# Patient Record
Sex: Male | Born: 1950 | Race: White | Hispanic: No | Marital: Married | Smoking: Former smoker
Health system: Southern US, Community
[De-identification: ages and names within clinical notes are randomized; demographics above are authoritative.]

## PROBLEM LIST (undated history)

## (undated) DIAGNOSIS — R51 Headache: Secondary | ICD-10-CM

## (undated) DIAGNOSIS — Z95810 Presence of automatic (implantable) cardiac defibrillator: Secondary | ICD-10-CM

## (undated) DIAGNOSIS — J189 Pneumonia, unspecified organism: Secondary | ICD-10-CM

## (undated) DIAGNOSIS — I255 Ischemic cardiomyopathy: Secondary | ICD-10-CM

## (undated) DIAGNOSIS — Z950 Presence of cardiac pacemaker: Secondary | ICD-10-CM

## (undated) DIAGNOSIS — I472 Ventricular tachycardia, unspecified: Secondary | ICD-10-CM

## (undated) DIAGNOSIS — K219 Gastro-esophageal reflux disease without esophagitis: Secondary | ICD-10-CM

## (undated) DIAGNOSIS — J439 Emphysema, unspecified: Secondary | ICD-10-CM

## (undated) DIAGNOSIS — I5032 Chronic diastolic (congestive) heart failure: Secondary | ICD-10-CM

## (undated) DIAGNOSIS — R0602 Shortness of breath: Secondary | ICD-10-CM

## (undated) DIAGNOSIS — I5022 Chronic systolic (congestive) heart failure: Secondary | ICD-10-CM

## (undated) DIAGNOSIS — F329 Major depressive disorder, single episode, unspecified: Secondary | ICD-10-CM

## (undated) DIAGNOSIS — F32A Depression, unspecified: Secondary | ICD-10-CM

## (undated) DIAGNOSIS — N189 Chronic kidney disease, unspecified: Secondary | ICD-10-CM

## (undated) DIAGNOSIS — F419 Anxiety disorder, unspecified: Secondary | ICD-10-CM

## (undated) HISTORY — DX: Chronic systolic (congestive) heart failure: I50.22

## (undated) HISTORY — PX: EYE SURGERY: SHX253

## (undated) HISTORY — DX: Emphysema, unspecified: J43.9

## (undated) HISTORY — PX: CORONARY ANGIOPLASTY: SHX604

## (undated) HISTORY — DX: Chronic diastolic (congestive) heart failure: I50.32

---

## 1997-02-08 HISTORY — PX: HERNIA REPAIR: SHX51

## 2002-06-18 ENCOUNTER — Emergency Department (HOSPITAL_COMMUNITY): Admission: AD | Admit: 2002-06-18 | Discharge: 2002-06-18 | Payer: Self-pay | Admitting: Emergency Medicine

## 2008-06-08 HISTORY — PX: CARDIAC DEFIBRILLATOR PLACEMENT: SHX171

## 2008-06-08 HISTORY — PX: CORONARY STENT PLACEMENT: SHX1402

## 2008-06-12 ENCOUNTER — Inpatient Hospital Stay: Payer: Self-pay | Admitting: Cardiovascular Disease

## 2008-06-12 ENCOUNTER — Encounter: Payer: Self-pay | Admitting: Cardiovascular Disease

## 2008-06-21 ENCOUNTER — Encounter: Payer: Self-pay | Admitting: Cardiovascular Disease

## 2008-07-29 ENCOUNTER — Encounter: Payer: Self-pay | Admitting: Cardiovascular Disease

## 2008-09-18 ENCOUNTER — Encounter: Payer: Self-pay | Admitting: Cardiovascular Disease

## 2008-10-16 ENCOUNTER — Encounter: Payer: Self-pay | Admitting: Cardiovascular Disease

## 2008-11-29 ENCOUNTER — Ambulatory Visit: Payer: Self-pay | Admitting: Internal Medicine

## 2009-05-13 ENCOUNTER — Ambulatory Visit: Payer: Self-pay | Admitting: Ophthalmology

## 2009-05-15 ENCOUNTER — Encounter: Payer: Self-pay | Admitting: Cardiovascular Disease

## 2009-05-27 ENCOUNTER — Ambulatory Visit: Payer: Self-pay | Admitting: Ophthalmology

## 2009-07-03 ENCOUNTER — Ambulatory Visit: Payer: Self-pay | Admitting: Ophthalmology

## 2009-07-07 ENCOUNTER — Ambulatory Visit: Payer: Self-pay | Admitting: Ophthalmology

## 2009-09-09 ENCOUNTER — Encounter: Payer: Self-pay | Admitting: Cardiovascular Disease

## 2009-11-19 ENCOUNTER — Ambulatory Visit: Payer: Self-pay | Admitting: Cardiovascular Disease

## 2009-11-19 ENCOUNTER — Telehealth: Payer: Self-pay | Admitting: Cardiovascular Disease

## 2009-11-19 DIAGNOSIS — Z95810 Presence of automatic (implantable) cardiac defibrillator: Secondary | ICD-10-CM | POA: Insufficient documentation

## 2009-11-19 DIAGNOSIS — I2510 Atherosclerotic heart disease of native coronary artery without angina pectoris: Secondary | ICD-10-CM | POA: Insufficient documentation

## 2009-11-19 DIAGNOSIS — E785 Hyperlipidemia, unspecified: Secondary | ICD-10-CM

## 2009-11-19 DIAGNOSIS — I2589 Other forms of chronic ischemic heart disease: Secondary | ICD-10-CM

## 2009-11-27 ENCOUNTER — Telehealth: Payer: Self-pay | Admitting: Cardiovascular Disease

## 2009-12-29 ENCOUNTER — Ambulatory Visit: Payer: Self-pay | Admitting: Internal Medicine

## 2009-12-29 DIAGNOSIS — I472 Ventricular tachycardia: Secondary | ICD-10-CM

## 2009-12-29 DIAGNOSIS — I5022 Chronic systolic (congestive) heart failure: Secondary | ICD-10-CM

## 2009-12-29 DIAGNOSIS — I959 Hypotension, unspecified: Secondary | ICD-10-CM

## 2010-01-26 ENCOUNTER — Ambulatory Visit: Payer: Self-pay | Admitting: Allergy

## 2010-03-10 NOTE — Letter (Signed)
Summary: Medical Record Release  Medical Record Release   Imported By: Robert [name] 11/28/2009 11:11:13  _____________________________________________________________________  External Attachment:    Type:   Image     Comment:   External Document

## 2010-03-10 NOTE — Assessment & Plan Note (Signed)
Summary: NP6/AMD   Visit Type:  Initial Consult Primary Provider:  Dr. Juel Burrow  CC:  Establish care with a local cardiologist.  Had an MI; stent 2010 at [facility].  Recently blood pressure has been increased with feeling fatigue and dizziness.Marland Kitchen  History of Present Illness: [name] is a very pleasant 60 year old gentleman, former veteran with a history smoking for 30 years, hyperlipidemia, severe coronary artery disease, ischemic cardiomyopathy with ejection fraction of 20-25% by echocardiogram in May 2010, history of stent to his LAD for 95% stenosis in 1996, remote history of chemical pneumonitis him a catheterization in May 2004 showing 70% mid RCA disease, 90% distal RCA disease, 70% LAD disease proximal to the stent who had Cypher stents placement to the RCA proximal, 3 x 13 mm.  He had a Stamey 400% occluded LAD proximally in May 2010 with intervention performed, subsequent VT and balloon pump placed with transfer to [facility]. Records are not available to [place] at this time. He does have an ICD for his ischemic heart myopathy and secondary prevention.  He presents to establish care. He reports that recently, over the past several weeks to months, he has been feeling very weak and dizzy. He describes a recent near syncopal episode where he almost passed out and hit his head on the nightstand. He does have recordings of his blood pressure which showed frequent systolic pressures in the 70s. He denies any chest pain or shortness of breath. We do not have his most recent echocardiogram from this year from Dr. Harl Bowie. I will try to obtain his records He denies any recent medication changes.  EKG shows normal sinus rhythm with rate 71 beats per minute with old anteroseptal infarct, low voltage through the limb leads.  Preventive Screening-Counseling & Management  Alcohol-Tobacco     Smoking Status: quit  Caffeine-Diet-Exercise     Does Patient Exercise: yes      Drug Use:  no.    Current  Medications (verified): 1)  Plavix 75 Mg Tabs (Clopidogrel Bisulfate) .... One Tablet Once Daily 2)  Omega-3 Fish Oil 1200 Mg Caps (Omega-3 Fatty Acids) .... One Tablet Once Daily 3)  Magnesium Oxide 400 Mg Tabs (Magnesium Oxide) .... One Tablet Once Daily 4)  Coreg 3.125 Mg Tabs (Carvedilol) .... One Tablet Every 12 Hours 5)  Spironolactone 25 Mg Tabs (Spironolactone) .... One Tablet Once Daily 6)  Fluticasone Propionate 50 Mcg/act Susp (Fluticasone Propionate) .... As Needed 7)  Aspir-Low 81 Mg Tbec (Aspirin) .... One Tablet Once Daily 8)  Simvastatin 40 Mg Tabs (Simvastatin) .... One Tablet At Bedtime 9)  Loratadine 10 Mg Tabs (Loratadine) .... One Tablet Once Daily 10)  Furosemide 80 Mg Tabs (Furosemide) .... One Tablet Once Daily 11)  Lisinopril 5 Mg Tabs (Lisinopril) .... 1/2 Tablet Once Daily 12)  Sertraline Hcl 50 Mg Tabs (Sertraline Hcl) .... One Tablet Once Daily 13)  Cetirizine Hcl 10 Mg Tabs (Cetirizine Hcl) .... One Tablet Once Daily 14)  Amiodarone Hcl 200 Mg Tabs (Amiodarone Hcl) .... One Tablet Once Daily 15)  Nitroglycerin 0.4 Mg/hr Pt24 (Nitroglycerin) .... As Needed 16)  Zolpidem Tartrate 5 Mg Tabs (Zolpidem Tartrate) .... One Tablet At Bedtime 17)  Spiriva Handihaler 18 Mcg Caps (Tiotropium Bromide Monohydrate) 18)  Ventolin Hfa 108 (90 Base) Mcg/act Aers (Albuterol Sulfate) .... Two Puffs Daily 19)  Patanase 0.6 % Soln (Olopatadine Hcl) .... Two Puffs Once Daily 20)  Dulera 100-5 Mcg/act Aero (Mometasone Furo-Formoterol Fum) .... As Needed  Allergies (verified): No Known  Drug Allergies  Past History:  Family History: Last updated: 11/19/2009 Mother: Living Father: deceased; Electricuted  Social History: Last updated: 11/19/2009 Disabled--2010 Married  Tobacco Use - Former. Smoked 1PPD x 30 years. 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 Smoked 1PPD x 30 years. Quit Jun 10, 2008 Alcohol Use - yes Regular Exercise - yes--yard work Drug Use - no Smoking Status:  quit Does Patient Exercise:  yes Drug Use:  no  Review of Systems  The patient denies fever, weight loss, weight gain, vision loss, decreased hearing, hoarseness, chest pain, syncope, dyspnea on exertion, peripheral edema, prolonged cough, abdominal pain, incontinence, muscle weakness, depression, and enlarged lymph nodes.         dizzy and weak  Vital Signs:  Patient profile:   60 year old male Height:      69 inches Weight:      164 pounds BMI:     24.31 Pulse rate:   71 / minute BP sitting:   109 / 69  (left arm) Cuff size:   regular  Vitals Entered By: Bishop Dublin, CMA (November 19, 2009 11:15 AM)  Physical Exam  General:  Well developed, well nourished, in no acute distress. Head:  normocephalic and atraumatic Neck:  Neck supple, no JVD. No masses, thyromegaly or abnormal cervical nodes. Lungs:  Clear bilaterally to auscultation and percussion. Heart:  Non-displaced PMI, chest non-tender; regular rate and rhythm, S1, S2 without murmurs, rubs or gallops. Carotid upstroke normal, no bruit. no widened aortic pulsation.  Pedals normal pulses. No edema, no varicosities. Abdomen:  Bowel sounds positive; abdomen soft and non-tender without masses Msk:  Back normal, normal gait. Muscle strength and tone normal. Pulses:  mildly diminished pulses in the lower extremities but good capillary refill in the upper and lower  extremities Extremities:  No clubbing or cyanosis. Neurologic:  Alert and oriented x 3. Skin:  Intact without lesions or rashes. Psych:  Normal affect.   Impression & Recommendations:  Problem # 1:  CARDIOMYOPATHY, ISCHEMIC (ICD-414.8) ejection fraction is 22%. He has hypotension which is likely secondary to his underlying cardiac disease. In an effort to improve his near syncopal episodes, we will change his lisinopril 2.5 mg 2 every other day. I'm also concerned that he could be mildly dehydrated as he is on Lasix 80 mg daily. I suggested he cut his Lasix back to 40 mg daily on a trial basis to see if this helps with his near syncope. On talking his most recent basic metabolic panel from Dr. Harl Bowie.  His updated medication list for this problem includes:    Plavix 75 Mg Tabs (Clopidogrel bisulfate) ..... One tablet once daily    Coreg 3.125 Mg Tabs (Carvedilol) ..... One tablet every 12 hours    Spironolactone 25 Mg Tabs (Spironolactone) ..... One tablet once daily    Aspir-low 81 Mg Tbec (Aspirin) ..... One tablet once daily    Furosemide 80 Mg Tabs (Furosemide) .Marland Kitchen... Take 1/2  tablet by mouth once a  day    Lisinopril 5 Mg Tabs (Lisinopril) .Marland Kitchen... Take 1/2 tab by mouth every other day.    Amiodarone Hcl 200 Mg Tabs (Amiodarone hcl) ..... One tablet once daily    Nitroglycerin 0.4 Mg/hr Pt24 (Nitroglycerin) .Marland Kitchen... As needed  Problem # 2:  CAD, NATIVE VESSEL (ICD-414.01) He denies any recent anginal type symptoms. No further testing at this time.  His updated medication list for this problem includes:    Plavix 75 Mg Tabs (Clopidogrel bisulfate) ..... One tablet once daily    Coreg 3.125 Mg Tabs (Carvedilol) ..... One tablet every 12 hours    Aspir-low 81 Mg Tbec (Aspirin) ..... One tablet once daily    Lisinopril 5 Mg Tabs (Lisinopril) .Marland Kitchen... Take 1/2 tab by mouth every other day.    Nitroglycerin 0.4 Mg/hr Pt24 (Nitroglycerin) .Marland Kitchen... As needed  Problem # 3:  HYPERLIPIDEMIA-MIXED  (ICD-272.4) Goal LDL is less than 70, total cholesterol less than 150. We'll try to obtain his lipids prior records.  His updated medication list for this problem includes:    Simvastatin 40 Mg Tabs (Simvastatin) ..... One tablet at bedtime  Problem # 4:  AUTOMATIC IMPLANTABLE CARDIAC DEFIBRILLATOR SITU (ICD-V45.02) Ejection fraction is 22 for 25%. We will set him up for a defibrillator check on a every 3 month basis with [place] [facility]. He has indicated that he does have a telephone adapter which we might be able to use for remote checking  Patient Instructions: 1)  Your physician has recommended you make the following change in your medication: DECREASE lisinopril to everyother day, lasix 80mg  1/2 tab daily  2)  Your physician wants you to follow-up in:   3 months You will receive a reminder letter in the mail two months in advance. If you don't receive a letter, please call our office to schedule the follow-up appointment.

## 2010-03-10 NOTE — Cardiovascular Report (Signed)
[facility]ist, [facility]   [facility]   Imported By: Roderic Ovens 12/01/2009 15:06:03  _____________________________________________________________________  External Attachment:    Type:   Image     Comment:   External Document

## 2010-03-10 NOTE — Letter (Signed)
Summary: Implanted Cardiovascular Devices   Implanted Cardiovascular Devices   Imported By: Roderic Ovens 12/02/2009 10:16:05  _____________________________________________________________________  External Attachment:    Type:   Image     Comment:   External Document

## 2010-03-10 NOTE — Progress Notes (Signed)
Summary: Dizziness  Phone Note Call from Patient   Caller: Patient Call For: [place] Summary of Call: Note entered in error on another pt from 11/14/09 Pt called stated needed to see Cardiologist in the next 24 hours. Hx of MI in the past year.  Three days ago pt started experiecing dizziness and blacked out. Pt reports BP at 71/47 and 84/56. Home meds lisinopril 5 mg 1/2 tab daily, furosemide 80mg  daily, spironalatone 25mg  daily and carvediolol 3.125 two times a day  Follow-up for Phone Call        Spoke to Dr. Mariah Milling recommened pt stop lisinopril and carvedilol. Continue monitoring BP and HR. If BP not improved can go to Urgent care or PCP and schedule pt for next available appt. Pt scheduled for 10/13. Pt is aware of medication changes and continue to monitor BP and HR will bring those records into visit. Benedict Needy, RN  November 19, 2009 11:26 AM

## 2010-03-10 NOTE — Cardiovascular Report (Signed)
Summary: Stent Patient Implant Card   Stent Patient Implant Card   Imported By: Roderic Ovens 12/02/2009 10:17:01  _____________________________________________________________________  External Attachment:    Type:   Image     Comment:   External Document

## 2010-03-10 NOTE — Assessment & Plan Note (Signed)
Summary: NEP/AMD   Visit Type:  Initial Consult Primary [name]:  Dr. Juel Burrow  CC:  Establish care for Defib check..  History of Present Illness: [name] is  seen at the request of Dr. Mariah Milling 2 established care for an ICD.  He is a  60 year old gentleman, former veteran with a history  ischemic heart disease with depressed left ventricular function.Hisejection fraction was  20-25% by echocardiogram in May 2010  At that time he presented with aanterior STEMI and was found at catheterization to occluded his LAD stent. (This is my best inference from reading catheter note).At that time he underwent insertion of balloon pump and was transferred to [facility]. An ICD was implanted. There is a suggestion of the old notes that this is a placed for secondary prevention.  The patient recalls having been told he had a problem for which an ablation or defibrillator would be appropriate; at that time he was intubated and lying at [facility]. They opted for the latter. He was put on amiodarone at that time and the dose was reduced subsequent to that. I should note that he was at [facility] for 3-4 months intubated and comatose for more than a month.  He has had problems with recurrent orthostatic syncope and has recorded his blood pressure at home with these episodes in the 50 range. Reduction of his diuretics has been associated with some improvement.  He has been better since then with less dizziness.     Current Medications (verified): 1)  Plavix 75 Mg Tabs (Clopidogrel Bisulfate) .... One Tablet Once Daily 2)  Omega-3 Fish Oil 1200 Mg Caps (Omega-3 Fatty Acids) .... One Tablet Once Daily 3)  Magnesium Oxide 400 Mg Tabs (Magnesium Oxide) .... One Tablet Once Daily 4)  Coreg 3.125 Mg Tabs (Carvedilol) .... One Tablet Every 12 Hours 5)  Spironolactone 25 Mg Tabs (Spironolactone) .... One Tablet Once Daily 6)  Fluticasone Propionate 50 Mcg/act Susp (Fluticasone Propionate) .... As Needed 7)  Aspir-Low 81 Mg Tbec  (Aspirin) .... One Tablet Once Daily 8)  Simvastatin 40 Mg Tabs (Simvastatin) .... One Tablet At Bedtime 9)  Loratadine 10 Mg Tabs (Loratadine) .... One Tablet Once Daily 10)  Furosemide 80 Mg Tabs (Furosemide) .... Take 1/2  Tablet By Mouth Once A Day 11)  Lisinopril 5 Mg Tabs (Lisinopril) .... Take 1/2 Tab By Mouth Every Other Day. 12)  Sertraline Hcl 50 Mg Tabs (Sertraline Hcl) .... One Tablet Once Daily 13)  Cetirizine Hcl 10 Mg Tabs (Cetirizine Hcl) .... One Tablet Once Daily 14)  Amiodarone Hcl 200 Mg Tabs (Amiodarone Hcl) .... One Tablet Once Daily 15)  Nitroglycerin 0.4 Mg/hr Pt24 (Nitroglycerin) .... As Needed 16)  Zolpidem Tartrate 5 Mg Tabs (Zolpidem Tartrate) .... One Tablet At Bedtime 17)  Spiriva Handihaler 18 Mcg Caps (Tiotropium Bromide Monohydrate) 18)  Ventolin Hfa 108 (90 Base) Mcg/act Aers (Albuterol Sulfate) .... Two Puffs Daily 19)  Patanase 0.6 % Soln (Olopatadine Hcl) .... Two Puffs Once Daily 20)  Dulera 100-5 Mcg/act Aero (Mometasone Furo-Formoterol Fum) .... 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 Smoked 1PPD x 30 years. Quit Jun 10, 2008 Alcohol Use - yes Regular Exercise - yes--yard work Drug Use - no  Risk Factors: Exercise: yes (11/19/2009)  Risk Factors: Smoking Status: quit (11/19/2009)  Vital Signs:  Patient profile:   60 year old male Height:      69 inches Weight:      167 pounds BMI:     24.75 Pulse  rate:   50 / minute BP sitting:   80 / 60  (left arm)  Vitals Entered By: Bishop Dublin, CMA (December 29, 2009 10:33 AM)  Physical Exam  General:  Well developed, well nourished,middle-age Caucasian male appearing his stated age in no acute distress. Head:  normal HEENT Neck:  JVP 6-7; neck supple Chest Wall:  without kyphosis Lungs:  clear to auscultation Heart:  regular rate and rhythm without an S3 Abdomen:  soft distended active bowel sounds without hepatomegaly Msk:  no musculoskeletal deformities Pulses:  intact distal pulses Extremities:  no clubbing cyanosis or edema Neurologic:  alert and oriented and grossly normal Skin:  warm and dry Cervical Nodes:  without adenopathy Psych:  engaging   EKG  Procedure date:  11/19/2009  Findings:      sinus rhythm at 71 Intervals 0.21/0.10/0.44 Axis is rightward at 117 Prior anteroseptal MI   ICD Specifications Following MD:  Sherryl Manges, MD     ICD Vendor:  [employer]     ICD Model Number:  [id number]     ICD Serial Number:  [id number] ICD DOI:  08/19/2008     ICD Implanting MD:  IMPLANTED @ [facility]  Lead 1:    Location: RA     DOI: 08/19/2008     Model #: [id number]     Serial #: [id number]     Status: active Lead 2:    Location: RV     DOI: 08/19/2008     Model #: [id number]     Serial #: [id number]     Status: active  Indications::  ICM   ICD Follow Up Remote Check?  No Battery Voltage:  3.14 V     Charge Time:  8.7 seconds     Underlying rhythm:  SR ICD Dependent:  No       ICD Device Measurements Atrium:  Amplitude: 2.1 mV, Impedance: 494 ohms, Threshold: 1.0 V at 0.4 msec Right Ventricle:  Amplitude: 15.9 mV, Impedance: 437 ohms, Threshold: 1.125 V at 0.4 msec Shock Impedance: 37/44 ohms   Episodes MS Episodes:  0     Percent Mode Switch:  0     Coumadin:  No Shock:  0     ATP:  0     Nonsustained:  0     Atrial Pacing:  21.7%     Ventricular Pacing:  0.2%  Brady Parameters Mode DDD     Lower Rate Limit:  60     Upper Rate  Limit 130 PAV 180     Sensed AV Delay:  150  Tachy Zones VF:  188     Next Remote Date:  04/02/2010     Next Cardiology Appt Due:  12/10/2010 Tech Comments:  Lead impedance alert values reprogrammed.  Optivol and thoracic impedance abnormal. 11/12-11/19.  Carelink transmissions every 3 months.  ROV 1 year with Dr. Graciela Husbands in [place]. Altha Harm, LPN  December 29, 2009 10:50 AM   Impression & Recommendations:  Problem # 1:  AUTOMATIC IMPLANTABLE CARDIAC DEFIBRILLATOR SITU (ICD-V45.02) Device  parameters and data were reviewed; given his hypotension I have increased his daytime heart rate from 60-75. Sleep mode was activated. The device is programmed as a single zone with ATP while charting.  There have been no intercurrent arrhythmias.  Problem # 2:  VENTRICULAR TACHYCARDIA (ICD-427.1)  aabove  His updated medication list for this problem includes:    Plavix 75 Mg Tabs (Clopidogrel bisulfate) ..... One tablet once daily    Coreg 3.125 Mg Tabs (Carvedilol) ..... One tablet every 12 hours    Aspir-low 81 Mg Tbec (Aspirin) ..... One tablet once daily    Lisinopril 5 Mg Tabs (Lisinopril) .Marland Kitchen... Take 1/2 tab by mouth every other day.    Amiodarone Hcl 200 Mg Tabs (Amiodarone hcl) .Marland Kitchen... Take one half tablet once daily    Nitroglycerin 0.4 Mg/hr Pt24 (Nitroglycerin) .Marland Kitchen... As needed  Problem # 3:  HYPOTENSION, UNSPECIFIED (ICD-458.9) given his hypotension, I have increased his heart rate. I will also decrease his spironolactone from 25-12.5.  Is in my impression that amiodarone can also be associated with orthostatic intolerance. As there had been no intercurrent arrhythmias since the device was implanted, I , after a long discussion with the patien, have elected to decrease his amiodarone from 200-100 day with the hopes of being to further decrease it.  We will leave his Lasix at its reduced dose.  Problem # 4:  SYSTOLIC HEART FAILURE, CHRONIC (ICD-428.22) Interestingly his optivol  measurement demonstrates a reduction since he saw Dr. Mariah Milling a few weeks ago at which time his Lasix dose was decreased. We will keep it at a reduced dose.  Problem # 5:  CARDIOMYOPATHY, ISCHEMIC (ICD-414.8)  stable without chest pain  His updated medication list for this problem includes:    Plavix 75 Mg Tabs (Clopidogrel bisulfate) ..... One tablet once daily    Coreg 3.125 Mg Tabs (Carvedilol) ..... One tablet every 12 hours    Spironolactone 25 Mg Tabs (Spironolactone) .Marland Kitchen... Take one half  tablet once daily    Aspir-low 81 Mg Tbec (Aspirin) ..... One tablet once daily    Furosemide 80 Mg Tabs (Furosemide) .Marland Kitchen... Take 1/2  tablet by mouth once a day    Lisinopril 5 Mg Tabs (Lisinopril) .Marland Kitchen... Take 1/2 tab by mouth every other day.    Amiodarone Hcl 200 Mg Tabs (Amiodarone hcl) .Marland Kitchen... Take one half tablet once daily    Nitroglycerin 0.4 Mg/hr Pt24 (Nitroglycerin) .Marland Kitchen... As needed  Patient Instructions: 1)  Your physician recommends that you schedule a follow-up appointment in: 3 months 2)  Your physician has recommended you make the following change in your medication: Decrease Spironalactone 25mg   to 12.5mg  daily. Decrease Amiodarone from 200mg  daily to 100mg  daily.

## 2010-03-10 NOTE — Progress Notes (Signed)
Summary: FYI  Phone Note Call from Patient Call back at [facility] Phone [phone]   Caller: SELF Call For: [facility] Summary of Call: PT WANTED TO LET THE NURSE KNOW THAT HE IS FEELING BETTER AND THAT HIS BP IS UP AND STABLE Initial call taken by: Harlon Flor,  November 27, 2009 2:02 PM

## 2010-03-10 NOTE — Cardiovascular Report (Signed)
Summary: Office Visit  Office Visit   Imported By: Marylou Mccoy 01/07/2010 11:52:52  _____________________________________________________________________  External Attachment:    Type:   Image     Comment:   External Document

## 2010-04-01 ENCOUNTER — Encounter: Payer: Self-pay | Admitting: Internal Medicine

## 2010-04-07 ENCOUNTER — Encounter: Payer: Self-pay | Admitting: Internal Medicine

## 2010-04-14 ENCOUNTER — Encounter (INDEPENDENT_AMBULATORY_CARE_PROVIDER_SITE_OTHER): Payer: Medicaid Other | Admitting: Internal Medicine

## 2010-04-14 ENCOUNTER — Encounter: Payer: Self-pay | Admitting: Internal Medicine

## 2010-04-14 DIAGNOSIS — R000 Tachycardia, unspecified: Secondary | ICD-10-CM

## 2010-04-14 DIAGNOSIS — I2510 Atherosclerotic heart disease of native coronary artery without angina pectoris: Secondary | ICD-10-CM

## 2010-04-14 DIAGNOSIS — R079 Chest pain, unspecified: Secondary | ICD-10-CM | POA: Insufficient documentation

## 2010-04-14 DIAGNOSIS — I472 Ventricular tachycardia: Secondary | ICD-10-CM

## 2010-04-14 DIAGNOSIS — R072 Precordial pain: Secondary | ICD-10-CM

## 2010-04-14 DIAGNOSIS — I509 Heart failure, unspecified: Secondary | ICD-10-CM

## 2010-04-17 ENCOUNTER — Ambulatory Visit: Payer: Self-pay | Admitting: Internal Medicine

## 2010-04-17 ENCOUNTER — Encounter: Payer: Self-pay | Admitting: Internal Medicine

## 2010-04-17 ENCOUNTER — Encounter: Payer: Self-pay | Admitting: Cardiovascular Disease

## 2010-04-17 DIAGNOSIS — R079 Chest pain, unspecified: Secondary | ICD-10-CM

## 2010-04-21 NOTE — Assessment & Plan Note (Signed)
Summary: PACEMAKER/AMD   Visit Type:  Follow-up Primary Provider:  Dr. Juel Burrow  CC:  c/o elevated blood pressure, left arm pain, and jaw pain and headache; symptoms started last Sat. night.  He also has noticed that heart rate is not staying around 60 after 10:00.  He states around 2:00 in the a.m. heart rate is around 75-80.Marland Kitchen  History of Present Illness: [name] is  seen in followup for orthostatic syncope.He has a history of an ICD implanted for secondary prevention at [facility] following presentation with an anterior STEMI with shock requiring balloon pump support. Notably this hospitalization at [facility] lasted 3-4 months during at least a large part of the time he was comatose.  He also required concomitant treatment with amiodarone.    At his last visit the amiodarone was decreased because of its potential contribution to orthostatic intolerance; some of his orthostasis had been improved by reductions of his diuretics. His blood pressure was low and we had decreased his Aldactone.  his orthostatic intolerance at this point is much improved.  Of note he had an episode 3 or 4 days ago from which he awakened. He had discomfort in his chest jaw or arm and neck. It was self resolving. He reminded him of his preceding angina. He has had no evaluation of ischemia subsequent to his revascularization        Current Medications (verified): 1)  Plavix 75 Mg Tabs (Clopidogrel Bisulfate) .... One Tablet Once Daily 2)  Omega-3 Fish Oil 1200 Mg Caps (Omega-3 Fatty Acids) .... One Tablet Once Daily 3)  Magnesium Oxide 400 Mg Tabs (Magnesium Oxide) .... One Tablet Once Daily 4)  Coreg 3.125 Mg Tabs (Carvedilol) .... One Tablet Every 12 Hours 5)  Spironolactone 25 Mg Tabs (Spironolactone) .... Take One Half  Tablet Once Daily 6)  Fluticasone Propionate 50 Mcg/act Susp (Fluticasone Propionate) .... As Needed 7)  Aspir-Low 81 Mg Tbec (Aspirin) .... One Tablet Once Daily 8)  Simvastatin 40 Mg Tabs  (Simvastatin) .... One Tablet At Bedtime 9)  Loratadine 10 Mg Tabs (Loratadine) .... One Tablet Once Daily 10)  Furosemide 80 Mg Tabs (Furosemide) .... Take 1/2  Tablet By Mouth Once A Day 11)  Lisinopril 5 Mg Tabs (Lisinopril) .... Take 1/2 Tab By Mouth Every Other Day. 12)  Sertraline Hcl 50 Mg Tabs (Sertraline Hcl) .... One Tablet Once Daily 13)  Cetirizine Hcl 10 Mg Tabs (Cetirizine Hcl) .... One Tablet Once Daily 14)  Amiodarone Hcl 200 Mg Tabs (Amiodarone Hcl) .... Take One Half Tablet Once Daily 15)  Nitroglycerin 0.4 Mg/hr Pt24 (Nitroglycerin) .... As Needed 16)  Zolpidem Tartrate 5 Mg Tabs (Zolpidem Tartrate) .... One Tablet At Bedtime 17)  Spiriva Handihaler 18 Mcg Caps (Tiotropium Bromide Monohydrate) 18)  Ventolin Hfa 108 (90 Base) Mcg/act Aers (Albuterol Sulfate) .... Two Puffs Daily 19)  Patanase 0.6 % Soln (Olopatadine Hcl) .... Two Puffs Once Daily 20)  Dulera 100-5 Mcg/act Aero (Mometasone Furo-Formoterol Fum) .... As Needed  Allergies (verified): No Known Drug Allergies  Past History:  Past Medical History: Last updated: 11/19/2009 Emphysema, obstructive Combined systolic and diastolic heart failure, chronic CAD;Coronary atherosclerosis of the native coronary artery MI; stent x 2   Past Surgical History: Last updated: 11/19/2009 CAD; MI, stents x 09 Jun 2008 AICD implant May 2010 Double hernia repair 1999  Family History: Last updated: 11/19/2009 Mother: Living Father: deceased; Electricuted  Social History: Last updated: 11/19/2009 Disabled--2010 Married  Tobacco Use - Former. Smoked 1PPD x 30 years. Quit  Jun 10, 2008 Alcohol Use - yes Regular Exercise - yes--yard work Drug Use - no  Risk Factors: Exercise: yes (11/19/2009)  Risk Factors: Smoking Status: quit (11/19/2009)  Vital Signs:  Patient profile:   60 year old male Height:      69 inches Weight:      172 pounds BMI:     25.49 Pulse rate:   66 / minute BP sitting:   100 / 54  (left  arm) Cuff size:   regular  Vitals Entered By: Bishop Dublin, CMA (April 14, 2010 8:55 AM)  Physical Exam  General:  The patient was alert and oriented in no acute distress. HEENT Normal.  Neck veins were flat, carotids were brisk.  Lungs were clear.  Heart sounds were regular without murmurs or gallops.  Abdomen was soft with active bowel sounds. There is no clubbing cyanosis or edema. Skin Warm and dry     ICD Specifications Following MD:  Sherryl Manges, MD     ICD Vendor:  Medtronic     ICD Model Number:  [id number]     ICD Serial Number:  [id number] ICD DOI:  08/19/2008     ICD Implanting MD:  IMPLANTED @ [facility]  Lead 1:    Location: RA     DOI: 08/19/2008     Model #: [id number]     Serial #: [id number]     Status: active Lead 2:    Location: RV     DOI: 08/19/2008     Model #: 6213     Serial #: [id number]     Status: active  Indications::  ICM   ICD Follow Up ICD Dependent:  No      Episodes Coumadin:  No  Brady Parameters Mode DDD     Lower Rate Limit:  60     Upper Rate Limit 130 PAV 180     Sensed AV Delay:  150  Tachy Zones VF:  188     Impression & Recommendations:  Problem # 1:  CHEST PAIN (ICD-786.50)  the patient a recurrent episode of chest discomfort. With his high pretest probability, and was admitted for Myoview scanning; he is advised to go to hospital if he has any recurrent discomfort His updated medication list for this problem includes:    Plavix 75 Mg Tabs (Clopidogrel bisulfate) ..... One tablet once daily    Coreg 3.125 Mg Tabs (Carvedilol) ..... One tablet every 12 hours    Aspir-low 81 Mg Tbec (Aspirin) ..... One tablet once daily    Lisinopril 5 Mg Tabs (Lisinopril) .Marland Kitchen... Take 1/2 tab by mouth every other day.    Nitrostat 0.4 Mg Subl (Nitroglycerin) .Marland Kitchen... 1 tablet under tongue at onset of chest pain; you may repeat every 5 minutes for up to 3 doses.  Orders: Nuclear Stress Test (Nuc Stress Test)  Problem # 2:  HYPOTENSION, UNSPECIFIED  (ICD-458.9) blood pressure issues are much resolved. There may be for up titration of his carvedilol at his next visit  Problem # 3:  VENTRICULAR TACHYCARDIA (ICD-427.1)  no intercurrent ventricular tachycardia His updated medication list for this problem includes:    Plavix 75 Mg Tabs (Clopidogrel bisulfate) ..... One tablet once daily    Coreg 3.125 Mg Tabs (Carvedilol) ..... One tablet every 12 hours    Aspir-low 81 Mg Tbec (Aspirin) ..... One tablet once daily    Lisinopril 5 Mg Tabs (Lisinopril) .Marland Kitchen... Take 1/2 tab by mouth every other day.    Amiodarone  Hcl 200 Mg Tabs (Amiodarone hcl) .Marland Kitchen... Take one half tablet once daily    Nitrostat 0.4 Mg Subl (Nitroglycerin) .Marland Kitchen... 1 tablet under tongue at onset of chest pain; you may repeat every 5 minutes for up to 3 doses.  Problem # 4:  HYPERLIPIDEMIA-MIXED (ICD-272.4) we will need to decrease his simvastatin from 40-20 mg because of interactions with amiodarone.  we will give him Crestor samples and he will look into relative cost of Crestor versus generic Lipitor. Reviewing his laboratories today his he is on 120; he also had hypertriglyceridemia    Simvastatin 40 Mg Tabs (Simvastatin) ..... One tablet at bedtime  Patient Instructions: 1)  Your physician recommends that you schedule a follow-up appointment in: 1 year 2)  Your physician has recommended you make the following change in your medication: STOP Simvastain. START Crestor 10mg  once daily, take samples given, if this medication works for you call our office and we can send in prescription.  3)  Your physician has requested that you have a Lexiscan myoview.  For further information please visit [email or web address]  Please follow instruction sheet, as given. You were given instructions for this procedure at your office visit today. Prescriptions: NITROSTAT 0.4 MG SUBL (NITROGLYCERIN) 1 tablet under tongue at onset of chest pain; you may repeat every 5 minutes for up to 3 doses.  #25 x 3    Entered by:   Lanny Hurst RN   Authorized by:   Nathen May, MD, [facility]   Signed by:   Lanny Hurst RN on 04/14/2010   Method used:   Electronically to        [id number]       [address]       [place], [place]  [address]       Ph: [phone]       Fax: [phone]   RxID:   [id number] CRESTOR 10 MG TABS (ROSUVASTATIN CALCIUM) Take one tablet by mouth daily.  #14 x 0   Entered by:   Lanny Hurst RN   Authorized by:   Nathen May, MD, [facility]   Signed by:   Lanny Hurst RN on 04/14/2010   Method used:   Samples Given   RxID:   [id number] MAGNESIUM OXIDE 400 MG TABS (MAGNESIUM OXIDE) one tablet once daily  #30 x 6   Entered by:   Lanny Hurst RN   Authorized by:   Nathen May, MD, [facility]   Signed by:   Lanny Hurst RN on 04/14/2010   Method used:   Electronically to        [id number] ([facility]       [address]       [place], [place]  [address]       Ph: [phone]       Fax: [phone]   RxID:   [id number]

## 2010-04-21 NOTE — Letter (Signed)
Summary: Radio broadcast assistant at [address]   [place], [place] [address]   Phone: [phone]  Fax: [phone]      [facility] MYOVIEW  Patient Name:  Robert [name]          MRN:  [id number]  DOB:  [birth year]             Weight:  172lb Home Telephone:  [phone]           Work Phone:  [phone] Referring Physician:   _____ Gated Myoview  __X___ Lexi Scan   Instructions regarding medication: DO NOT TAKE Carvedilol (Coreg), Spironolactone, Furosemide morning of your procedure.    PLEASE NOTIFY THE OFFICE AT LEAST 24 HOURS IN ADVANCE IF YOU ARE UNABLE TO KEEP YOUR APPOINTMENT.  [phone]   How to prepare for your Myoview test:  1)   Do not eat or drink 4 hours prior to arrival time. 2)   No caffeine or decaffeinated 12 hours prior to arrival. 3)   Your medication may be taken with water.  If your doctor stopped a        medicaiton because of this test, do not take that medication. 4)   Ladies, please do not wear dresses.  Skirts or pants are appropriate.       Please wear a short sleeve shirt. 5)   No perfume, cologne or lotion. 6)   Wear comfortable walking shoes.  No heels!    PLEASE REPORT TO [facility] MEDICAL MALL ENTRANCE. THE VOLUNTEERS AT THE FIRST DESK WILL DIRECT YOU WHERE TO GO.

## 2010-04-27 ENCOUNTER — Encounter: Payer: Self-pay | Admitting: Cardiovascular Disease

## 2010-04-27 ENCOUNTER — Ambulatory Visit (INDEPENDENT_AMBULATORY_CARE_PROVIDER_SITE_OTHER): Payer: Medicaid Other | Admitting: Cardiovascular Disease

## 2010-04-27 DIAGNOSIS — R0789 Other chest pain: Secondary | ICD-10-CM

## 2010-04-27 DIAGNOSIS — I2510 Atherosclerotic heart disease of native coronary artery without angina pectoris: Secondary | ICD-10-CM

## 2010-04-27 DIAGNOSIS — E7800 Pure hypercholesterolemia, unspecified: Secondary | ICD-10-CM

## 2010-05-07 NOTE — Assessment & Plan Note (Signed)
Summary: F/U MYOVIEW AT [facility]/NE   Visit Type:  Follow-up Primary Provider:  Dr. Juel [name]  CC:  F/U from [facility] @ [facility].  c/o chest pain & shortness of breath..  History of Present Illness: [name] has a hx of CAD,  anterior STEMI with shock requiring balloon pump support, hospitalization at [facility] lasted 3-4 months during at least a large part of the time he was comatose, orthostatic syncope, history of an ICD implanted for secondary prevention at [facility],  treatment with amiodarone, recently seen by Dr. Alberteen [name] for chest pain, jaw pain felt concerning for angina.  He had a stress test at [facility] last week that showed diffuse decreased uptake both at stress and rest. No clear ischemia noted. Uncertain if this was a poor technical study or multivessel disease.  He presents today in reports that he has had no further episodes of chest pain. He has been active, push mows without any difficulty. No significant chest pain or lightheadedness.  recently, his amiodarone was decreased because of its potential contribution to orthostatic intolerance; some of his orthostasis had been improved by reductions of his diuretics. His blood pressure was low and we had decreased his Aldactone.  his orthostatic intolerance at this point is much improved.  EKG was not performed today        Current Medications (verified): 1)  Plavix 75 Mg Tabs (Clopidogrel Bisulfate) .... One Tablet Once Daily 2)  Omega-3 Fish Oil 1200 Mg Caps (Omega-3 Fatty Acids) .... One Tablet Once Daily 3)  Magnesium Oxide 400 Mg Tabs (Magnesium Oxide) .... One Tablet Once Daily 4)  Coreg 3.125 Mg Tabs (Carvedilol) .... One Tablet Every 12 Hours 5)  Spironolactone 25 Mg Tabs (Spironolactone) .... Take One Half  Tablet Once Daily 6)  Fluticasone Propionate 50 Mcg/act Susp (Fluticasone Propionate) .... As Needed 7)  Aspir-Low 81 Mg Tbec (Aspirin) .... One Tablet Once Daily 8)  Loratadine 10 Mg Tabs (Loratadine) .... One Tablet Once Daily 9)   Furosemide 80 Mg Tabs (Furosemide) .... Take 1/2  Tablet By Mouth Once A Day 10)  Lisinopril 5 Mg Tabs (Lisinopril) .... Take 1/2 Tab By Mouth Every Other Day. 11)  Sertraline Hcl 50 Mg Tabs (Sertraline Hcl) .... One Tablet Once Daily 12)  Cetirizine Hcl 10 Mg Tabs (Cetirizine Hcl) .... One Tablet Once Daily 13)  Amiodarone Hcl 200 Mg Tabs (Amiodarone Hcl) .... Take One Half Tablet Once Daily 14)  Nitrostat 0.4 Mg Subl (Nitroglycerin) .Marland Kitchen.. 1 Tablet Under Tongue At Onset of Chest Pain; You May Repeat Every 5 Minutes For Up To 3 Doses. 15)  Zolpidem Tartrate 5 Mg Tabs (Zolpidem Tartrate) .... One Tablet At Bedtime 16)  Spiriva Handihaler 18 Mcg Caps (Tiotropium Bromide Monohydrate) 17)  Ventolin Hfa 108 (90 Base) Mcg/act Aers (Albuterol Sulfate) .... Two Puffs Daily 18)  Patanase 0.6 % Soln (Olopatadine Hcl) .... Two Puffs Once Daily 19)  Dulera 100-5 Mcg/act Aero (Mometasone Furo-Formoterol Fum) .... As Needed 20)  Crestor 10 Mg Tabs (Rosuvastatin Calcium) .... Take One Tablet By Mouth Daily. 21)  Ciprofloxacin Hcl 500 Mg Tabs (Ciprofloxacin Hcl) .... One Tablet Two Times A Day 22)  Clindamycin Phosphate 1 % Gel (Clindamycin Phosphate) .... Daily  Allergies (verified): No Known Drug Allergies  Past History:  Past Medical History: Last updated: 11/19/2009 Emphysema, obstructive Combined systolic and diastolic heart failure, chronic CAD;Coronary atherosclerosis of the native coronary artery MI; stent x 2   Past Surgical History: Last updated: 11/19/2009 CAD; MI, stents x 09 Jun 2008 AICD implant May 2010 Double hernia repair 1999  Family History: Last updated: 11/19/2009 Mother: Living Father: deceased; Electricuted  Social History: Last updated: 11/19/2009 Disabled--2010 Married  Tobacco Use - Former. Smoked 1PPD x 30 years. Quit Jun 10, 2008 Alcohol Use - yes Regular Exercise - yes--yard work Drug Use - no  Risk Factors: Exercise: yes (11/19/2009)  Risk  Factors: Smoking Status: quit (11/19/2009)  Review of Systems  The patient denies fever, weight loss, weight gain, vision loss, decreased hearing, hoarseness, chest pain, syncope, dyspnea on exertion, peripheral edema, prolonged cough, abdominal pain, incontinence, muscle weakness, depression, and enlarged lymph nodes.    Vital Signs:  Patient profile:   60 year old male Height:      69 inches Weight:      170 pounds BMI:     25.20 Pulse rate:   80 / minute BP sitting:   104 / 72  (left arm) Cuff size:   regular  Vitals Entered By: Robert [name], CMA (April 27, 2010 10:46 AM)  Physical Exam  General:  The patient was alert and oriented in no acute distress. HEENT Normal.  Neck veins were flat, carotids were brisk.  Lungs were clear.  Heart sounds were regular without murmurs or gallops.  Abdomen was soft with active bowel sounds. There is no clubbing cyanosis or edema. Skin Warm and dry  Msk:  Back normal, normal gait. Muscle strength and tone normal. Neurologic:  Alert and oriented x 3. Skin:  Intact without lesions or rashes. Psych:  Normal affect.    ICD Specifications Following MD:  Robert Manges, MD     ICD Vendor:  [employer]tronic     ICD Model Number:  [id number]     ICD Serial Number:  [id number] ICD DOI:  08/19/2008     ICD Implanting MD:  IMPLANTED @ [facility]  Lead 1:    Location: RA     DOI: 08/19/2008     Model #: [id number]     Serial #: [id number]     Status: active Lead 2:    Location: RV     DOI: 08/19/2008     Model #: [id number]     Serial #: [id number]     Status: active  Indications::  ICM   ICD Follow Up ICD Dependent:  No      Episodes Coumadin:  No  Brady Parameters Mode DDD     Lower Rate Limit:  60     Upper Rate Limit 130 PAV 180     Sensed AV Delay:  150  Tachy Zones VF:  188     Impression & Recommendations:  Problem # 1:  SYSTOLIC HEART FAILURE, CHRONIC (ICD-428.22) ischemic heart moderately with severely depressed LV systolic function, status post  ICD. No signs of clinical heart failure on today's visit. Recent blood work from several weeks ago shows a elevated creatinine and BUN. We have suggested he decrease his Lasix to 80 mg every other day. Will try to advance his Coreg on his next visit.  His updated medication list for this problem includes:    Plavix 75 Mg Tabs (Clopidogrel bisulfate) ..... One tablet once daily    Coreg 3.125 Mg Tabs (Carvedilol) ..... One tablet every 12 hours    Spironolactone 25 Mg Tabs (Spironolactone) .Marland Kitchen... Take one half  tablet once daily    Aspir-low 81 Mg Tbec (Aspirin) ..... One tablet once daily    Furosemide 40 Mg Tabs (Furosemide) .Marland Kitchen... Take one tablet by mouth  every other day.    Lisinopril 5 Mg Tabs (Lisinopril) .Marland Kitchen... Take 1/2 tab by mouth every other day.    Amiodarone Hcl 200 Mg Tabs (Amiodarone hcl) .Marland Kitchen... Take one half tablet once daily    Nitrostat 0.4 Mg Subl (Nitroglycerin) .Marland Kitchen... 1 tablet under tongue at onset of chest pain; you may repeat every 5 minutes for up to 3 doses.  Problem # 2:  VENTRICULAR TACHYCARDIA (ICD-427.1) On amiodarone, ICD followed by Dr. Graciela Husbands.  His updated medication list for this problem includes:    Plavix 75 Mg Tabs (Clopidogrel bisulfate) ..... One tablet once daily    Coreg 3.125 Mg Tabs (Carvedilol) ..... One tablet every 12 hours    Aspir-low 81 Mg Tbec (Aspirin) ..... One tablet once daily    Lisinopril 5 Mg Tabs (Lisinopril) .Marland Kitchen... Take 1/2 tab by mouth every other day.    Amiodarone Hcl 200 Mg Tabs (Amiodarone hcl) .Marland Kitchen... Take one half tablet once daily    Nitrostat 0.4 Mg Subl (Nitroglycerin) .Marland Kitchen... 1 tablet under tongue at onset of chest pain; you may repeat every 5 minutes for up to 3 doses.  Problem # 3:  CAD, NATIVE VESSEL (ICD-414.01) He has had episodes of chest pain. Stress test was equivocal with decreased uptake globally both at stress and rest. Grossly, no ischemia. If he continues to have episodes of chest pain, we would consider going straight  to cardiac catheterization.  His updated medication list for this problem includes:    Plavix 75 Mg Tabs (Clopidogrel bisulfate) ..... One tablet once daily    Coreg 3.125 Mg Tabs (Carvedilol) ..... One tablet every 12 hours    Aspir-low 81 Mg Tbec (Aspirin) ..... One tablet once daily    Lisinopril 5 Mg Tabs (Lisinopril) .Marland Kitchen... Take 1/2 tab by mouth every other day.    Nitrostat 0.4 Mg Subl (Nitroglycerin) .Marland Kitchen... 1 tablet under tongue at onset of chest pain; you may repeat every 5 minutes for up to 3 doses.  Problem # 4:  HYPERLIPIDEMIA-MIXED (ICD-272.4) Cholesterol has been elevated. He is taking Crestor 10 mg daily. Recently was changed from simvastatin 40 with which his LDL is 120. We will increase him to Crestor 40 mg daily. We'll likely need to add zetia 10 mg daily.  The following medications were removed from the medication list:    Crestor 20 Mg Tabs (Rosuvastatin calcium) .Marland Kitchen... Take one tablet by mouth daily. His updated medication list for this problem includes:    Crestor 40 Mg Tabs (Rosuvastatin calcium) .Marland Kitchen... Take one tablet by mouth daily.  Patient Instructions: 1)  Your physician recommends that you schedule a follow-up appointment in: 6 months 2)  Your physician recommends that you return for a FASTING lipid profile: in 2 months (BMP, Lipid/ LFT)  Your appt is 07/08/10 @ 8:30am 3)  Your physician has recommended you make the following change in your medication: DECREASE Furosemide 40mg  every other day. START Crestor 40mg  once daily. Prescriptions: CIALIS 5 MG TABS (TADALAFIL) Take one tablet once daily.  #30 x 6   Entered by:   Lanny Hurst RN   Authorized by:   Dossie Arbour MD   Signed by:   Lanny Hurst RN on 04/27/2010   Method used:   Electronically to        [id number]       [address]       [place], [place]  [address]       Ph: [phone]  Fax: [phone]   RxID:   [id number] FUROSEMIDE 40 MG TABS (FUROSEMIDE) Take one  tablet by mouth every other day.  #30 x 6   Entered by:   Lanny Hurst RN   Authorized by:   Dossie Arbour MD   Signed by:   Lanny Hurst RN on 04/27/2010   Method used:   Electronically to        [id number]       [address]       [place], [place]  [address]       Ph: [phone]       Fax: [phone]   RxID:   [id number] CRESTOR 40 MG TABS (ROSUVASTATIN CALCIUM) Take one tablet by mouth daily.  #30 x 6   Entered by:   Lanny Hurst RN   Authorized by:   Dossie Arbour MD   Signed by:   Lanny Hurst RN on 04/27/2010   Method used:   Electronically to        [id number]       [address]       [place], [place]  [address]       Ph: [phone]       Fax: [phone]   RxID:   [id number]   Appended Document: F/U MYOVIEW AT [facility] We will check blood count in 2 months time. Free trial prescription for Cialis given

## 2010-05-25 ENCOUNTER — Encounter: Payer: Self-pay | Admitting: Cardiovascular Disease

## 2010-07-08 ENCOUNTER — Other Ambulatory Visit: Payer: Medicaid Other | Admitting: *Deleted

## 2010-07-16 ENCOUNTER — Encounter: Payer: Medicaid Other | Admitting: *Deleted

## 2010-07-21 ENCOUNTER — Encounter: Payer: Self-pay | Admitting: *Deleted

## 2010-08-01 ENCOUNTER — Inpatient Hospital Stay: Payer: Self-pay | Admitting: Internal Medicine

## 2010-08-01 DIAGNOSIS — I214 Non-ST elevation (NSTEMI) myocardial infarction: Secondary | ICD-10-CM

## 2010-08-01 DIAGNOSIS — I059 Rheumatic mitral valve disease, unspecified: Secondary | ICD-10-CM

## 2010-08-03 DIAGNOSIS — I2510 Atherosclerotic heart disease of native coronary artery without angina pectoris: Secondary | ICD-10-CM

## 2010-08-04 DIAGNOSIS — R079 Chest pain, unspecified: Secondary | ICD-10-CM

## 2010-08-06 DIAGNOSIS — R0602 Shortness of breath: Secondary | ICD-10-CM

## 2010-08-07 DIAGNOSIS — R0602 Shortness of breath: Secondary | ICD-10-CM

## 2010-08-07 DIAGNOSIS — I214 Non-ST elevation (NSTEMI) myocardial infarction: Secondary | ICD-10-CM

## 2010-08-31 ENCOUNTER — Encounter: Payer: Self-pay | Admitting: Cardiovascular Disease

## 2010-09-02 ENCOUNTER — Encounter: Payer: Medicaid Other | Admitting: Cardiovascular Disease

## 2010-09-09 ENCOUNTER — Encounter: Payer: Self-pay | Admitting: Cardiovascular Disease

## 2010-10-01 ENCOUNTER — Inpatient Hospital Stay: Payer: Self-pay | Admitting: Internal Medicine

## 2010-10-02 DIAGNOSIS — J984 Other disorders of lung: Secondary | ICD-10-CM

## 2010-10-02 DIAGNOSIS — R9431 Abnormal electrocardiogram [ECG] [EKG]: Secondary | ICD-10-CM

## 2010-10-03 DIAGNOSIS — I214 Non-ST elevation (NSTEMI) myocardial infarction: Secondary | ICD-10-CM

## 2010-10-06 DIAGNOSIS — I509 Heart failure, unspecified: Secondary | ICD-10-CM

## 2010-10-08 DIAGNOSIS — I5023 Acute on chronic systolic (congestive) heart failure: Secondary | ICD-10-CM

## 2010-10-09 ENCOUNTER — Encounter: Payer: Self-pay | Admitting: Cardiology

## 2010-10-09 DIAGNOSIS — I2510 Atherosclerotic heart disease of native coronary artery without angina pectoris: Secondary | ICD-10-CM

## 2010-10-10 DIAGNOSIS — I5023 Acute on chronic systolic (congestive) heart failure: Secondary | ICD-10-CM

## 2010-10-10 DIAGNOSIS — I2510 Atherosclerotic heart disease of native coronary artery without angina pectoris: Secondary | ICD-10-CM

## 2010-10-26 ENCOUNTER — Encounter: Payer: Medicaid Other | Admitting: Cardiovascular Disease

## 2010-12-11 ENCOUNTER — Encounter: Payer: Self-pay | Admitting: Cardiovascular Disease

## 2010-12-15 ENCOUNTER — Encounter: Payer: Self-pay | Admitting: Cardiovascular Disease

## 2010-12-15 ENCOUNTER — Ambulatory Visit (INDEPENDENT_AMBULATORY_CARE_PROVIDER_SITE_OTHER): Payer: Medicare Other | Admitting: Cardiovascular Disease

## 2010-12-15 DIAGNOSIS — E785 Hyperlipidemia, unspecified: Secondary | ICD-10-CM

## 2010-12-15 DIAGNOSIS — Z95810 Presence of automatic (implantable) cardiac defibrillator: Secondary | ICD-10-CM

## 2010-12-15 DIAGNOSIS — R0602 Shortness of breath: Secondary | ICD-10-CM

## 2010-12-15 DIAGNOSIS — I2589 Other forms of chronic ischemic heart disease: Secondary | ICD-10-CM

## 2010-12-15 DIAGNOSIS — I498 Other specified cardiac arrhythmias: Secondary | ICD-10-CM

## 2010-12-15 DIAGNOSIS — I2510 Atherosclerotic heart disease of native coronary artery without angina pectoris: Secondary | ICD-10-CM

## 2010-12-15 DIAGNOSIS — I471 Supraventricular tachycardia: Secondary | ICD-10-CM

## 2010-12-15 MED ORDER — CARVEDILOL 6.25 MG PO TABS: 6.2500 mg | ORAL_TABLET | Freq: Two times a day (BID) | ORAL | Status: DC

## 2010-12-15 NOTE — Progress Notes (Signed)
Patient ID: Robert [name], male    DOB: 11/26/1950, 60 y.o.   MRN: [id number]  HPI Comments: Robert [name] has a hx of CAD,  anterior STEMI with shock requiring balloon pump support, hospitalization at [facility] lasted 3-4 months during at least a large part of the time he was comatose, orthostatic syncope, history of an ICD implanted for secondary prevention at [facility],  treatment with amiodarone,  stress test at [facility] showing diffuse decreased uptake both at stress and rest. No clear ischemia noted, Recent admission to [facility] for shortness of breath, found to be in SVT with rate 154 beats per minute, intubated for respiratory support, treated with gentle diuresis and dopamine for blood pressure support, who had a cardiac catheterization by Dr. Sherlie Ban several days later showing patent proximal LAD stents with 75% disease at the distal segment which was unchanged from prior cardiac catheterization in June 2012. He presents for routine followup from a hospital.  Overall he states that he is doing well and has no complaints. He denies any further symptoms of shortness of breath. He denies any tachycardia. He has not had any previous episodes of tachycardia that he can remember. He has minimal edema, is relatively active and feels well.  Marland Kitchen He reports he is taking metoprolol tartrate as well as carvedilol. Discharge paperwork report that he was supposed to be on carvedilol alone     amiodarone was Previously decreased because of its potential contribution to orthostatic intolerance; some of his orthostasis had been improved by reductions of his diuretics. His blood pressure was low and  his Aldactone Was decreased.  his orthostatic intolerance at this point is much improved.   EKG today shows normal sinus rhythm with rate 87 beats per minute with poor R-wave progression through the precordial leads, old anterior MI      Outpatient Encounter Prescriptions as of 12/15/2010  Medication Sig Dispense Refill  . albuterol  (VENTOLIN HFA) 108 (90 BASE) MCG/ACT inhaler Inhale 2 puffs into the lungs daily.        Marland Kitchen ALPRAZolam (XANAX) 0.5 MG tablet Take 0.5 mg by mouth 2 (two) times daily as needed.        Marland Kitchen aspirin 81 MG tablet Take 81 mg by mouth daily.        Marland Kitchen atorvastatin (LIPITOR) 40 MG tablet Take 40 mg by mouth daily.        . clopidogrel (PLAVIX) 75 MG tablet Take 75 mg by mouth daily.        . furosemide (LASIX) 40 MG tablet Take 40 mg by mouth daily.       Marland Kitchen lisinopril (PRINIVIL,ZESTRIL) 5 MG tablet Take 2.5 mg by mouth every other day.        . mometasone-formoterol (DULERA) 100-5 MCG/ACT AERO Inhale into the lungs as needed.        . nitroGLYCERIN (NITROSTAT) 0.4 MG SL tablet Place 0.4 mg under the tongue every 5 (five) minutes as needed. May repeat for up to 3 doses.       . Omega-3 Fatty Acids (OMEGA-3 FISH OIL) 1200 MG CAPS Take 1 capsule by mouth daily.        Marland Kitchen omeprazole (PRILOSEC) 20 MG capsule Take 20 mg by mouth 2 (two) times daily.        . sertraline (ZOLOFT) 50 MG tablet Take 50 mg by mouth daily.        Marland Kitchen spironolactone (ALDACTONE) 25 MG tablet Take 12.5 mg by mouth daily.        Marland Kitchen  tadalafil (CIALIS) 5 MG tablet Take 5 mg by mouth daily.        Marland Kitchen tiotropium (SPIRIVA) 18 MCG inhalation capsule Place 18 mcg into inhaler and inhale.        . zolpidem (AMBIEN) 5 MG tablet Take 5 mg by mouth at bedtime as needed.        .  carvedilol (COREG) 3.125 MG tablet Take 3.125 mg by mouth every 12 (twelve) hours.        .  metoprolol tartrate (LOPRESSOR) 25 MG tablet Take 1/2 tablet BID          Review of Systems  Constitutional: Negative.   HENT: Negative.   Eyes: Negative.   Respiratory: Negative.   Cardiovascular: Negative.   Gastrointestinal: Negative.   Musculoskeletal: Negative.   Skin: Negative.   Neurological: Negative.   Hematological: Negative.   Psychiatric/Behavioral: Negative.   All other systems reviewed and are negative.    BP 98/60  Pulse 86  Ht 5\' 9"  (1.753 m)  Wt 159 lb  (72.122 kg)  BMI 23.48 kg/m2  Physical Exam  Nursing note and vitals reviewed. Constitutional: He is oriented to person, place, and time. He appears well-developed and well-nourished.  HENT:  Head: Normocephalic.  Nose: Nose normal.  Mouth/Throat: Oropharynx is clear and moist.  Eyes: Conjunctivae are normal. Pupils are equal, round, and reactive to light.  Neck: Normal range of motion. Neck supple. No JVD present.  Cardiovascular: Normal rate, regular rhythm, S1 normal, S2 normal, normal heart sounds and intact distal pulses.  Exam reveals no gallop and no friction rub.   No murmur heard. Pulmonary/Chest: Effort normal and breath sounds normal. No respiratory distress. He has no wheezes. He has no rales. He exhibits no tenderness.  Abdominal: Soft. Bowel sounds are normal. He exhibits no distension. There is no tenderness.  Musculoskeletal: Normal range of motion. He exhibits no edema and no tenderness.  Lymphadenopathy:    He has no cervical adenopathy.  Neurological: He is alert and oriented to person, place, and time. Coordination normal.  Skin: Skin is warm and dry. No rash noted. No erythema.  Psychiatric: He has a normal mood and affect. His behavior is normal. Judgment and thought content normal.           Assessment and Plan

## 2010-12-15 NOTE — Assessment & Plan Note (Signed)
Recent cardiac catheterization suggesting 75% disease distal to the stent. Medical management has been recommended based on cardiac catheterization in June and most recently.

## 2010-12-15 NOTE — Patient Instructions (Signed)
You are doing well. Please stop the metoprolol Increase the coreg/carvedilol to 6.25 mg twice a day  Please call us if you have new issues that need to be addressed before your next appt.  The office will contact you for a follow up Appt. In 6 months

## 2010-12-15 NOTE — Assessment & Plan Note (Signed)
We'll continue beta blocker, ACE inhibitor, Aldactone

## 2010-12-15 NOTE — Assessment & Plan Note (Signed)
Follow up with Dr. Klein 

## 2010-12-15 NOTE — Assessment & Plan Note (Signed)
We'll continue on Lipitor. Goal LDL less than 70

## 2010-12-15 NOTE — Assessment & Plan Note (Signed)
Recent onset of shortness of breath in the setting of rapid narrow complex rhythm consistent with SVT. This arrhythmia likely precipitated his recent hospital course though unable to definitively exclude underlying lung pathology that might have contributed to his arrhythmia. We will hold his metoprolol, increase his carvedilol. We'll have him followup with Dr. Graciela Husbands for defibrillator interrogation, and any further management of his arrhythmia. As this would appear to be a single episode, perhaps watchful waiting for further arrhythmia would be an option. He has little reserve given his ischemic cardiomyopathy And is now wonders at a rate of 150 beats per minute sent into the hospital.

## 2010-12-28 ENCOUNTER — Encounter: Payer: Medicare Other | Admitting: Internal Medicine

## 2010-12-30 ENCOUNTER — Telehealth: Payer: Self-pay

## 2010-12-30 MED ORDER — ALPRAZOLAM 0.5 MG PO TABS: 0.5000 mg | ORAL_TABLET | Freq: Two times a day (BID) | ORAL | Status: DC | PRN

## 2010-12-30 NOTE — Telephone Encounter (Signed)
Refill per Dr. Mariah Milling sent for 28 tablets of xanax sent to [facility] pharmacy. The patient will need to follow up with PCP at [facility] for anymore refills.

## 2010-12-30 NOTE — Telephone Encounter (Signed)
Ok for a few weeks. No refill . Would let [facility] know

## 2010-12-30 NOTE — Telephone Encounter (Signed)
The patient was sent to a skilled nursing facility after leaving [facility] hospital.  He saw a physician at the facility but would not refill the Rx for xanax.  The patient is aware needs to contact the PCP for a refill but can't get through the office @ [facility]. Would you refill this time until he can get in touch with PCP?

## 2011-01-01 ENCOUNTER — Telehealth: Payer: Self-pay

## 2011-01-01 MED ORDER — ALPRAZOLAM 0.5 MG PO TABS: 0.5000 mg | ORAL_TABLET | Freq: Two times a day (BID) | ORAL | Status: DC | PRN

## 2011-01-01 NOTE — Telephone Encounter (Signed)
Spoke to pharmacy this am regarding Rx for xanax.  The pharmacist took the order and the dose is xanax 0.25 mg take one tablet bid prn. Notified patient refill should be ready for pick up at [facility] today.l

## 2011-01-01 NOTE — Telephone Encounter (Signed)
Refill sent to [facility] on [address] [place] for xanax.

## 2011-01-05 ENCOUNTER — Encounter: Payer: Medicare Other | Admitting: Internal Medicine

## 2011-01-14 ENCOUNTER — Encounter: Payer: Self-pay | Admitting: Internal Medicine

## 2011-01-25 ENCOUNTER — Ambulatory Visit (INDEPENDENT_AMBULATORY_CARE_PROVIDER_SITE_OTHER): Payer: Medicare Other | Admitting: Internal Medicine

## 2011-01-25 ENCOUNTER — Other Ambulatory Visit: Payer: Self-pay | Admitting: Internal Medicine

## 2011-01-25 ENCOUNTER — Encounter: Payer: Self-pay | Admitting: Internal Medicine

## 2011-01-25 DIAGNOSIS — Z95810 Presence of automatic (implantable) cardiac defibrillator: Secondary | ICD-10-CM

## 2011-01-25 DIAGNOSIS — I472 Ventricular tachycardia, unspecified: Secondary | ICD-10-CM

## 2011-01-25 DIAGNOSIS — I5022 Chronic systolic (congestive) heart failure: Secondary | ICD-10-CM

## 2011-01-25 DIAGNOSIS — I2589 Other forms of chronic ischemic heart disease: Secondary | ICD-10-CM

## 2011-01-25 LAB — ICD DEVICE OBSERVATION
AL THRESHOLD: 0.875 V
BATTERY VOLTAGE: 3.0989 V
CHARGE TIME: 9.309 s
FVT: 0
PACEART VT: 0
RV LEAD AMPLITUDE: 22.125 mv
TOT-0002: 0
TZAT-0001ATACH: 1
TZAT-0001ATACH: 2
TZAT-0001ATACH: 3
TZAT-0001SLOWVT: 1
TZAT-0002FASTVT: NEGATIVE
TZAT-0002SLOWVT: NEGATIVE
TZAT-0012ATACH: 150 ms
TZAT-0012ATACH: 150 ms
TZAT-0012FASTVT: 200 ms
TZAT-0012SLOWVT: 200 ms
TZAT-0018ATACH: NEGATIVE
TZAT-0018ATACH: NEGATIVE
TZAT-0018FASTVT: NEGATIVE
TZAT-0018SLOWVT: NEGATIVE
TZAT-0019FASTVT: 8 V
TZAT-0020ATACH: 1.5 ms
TZAT-0020ATACH: 1.5 ms
TZON-0003ATACH: 350 ms
TZST-0001ATACH: 4
TZST-0001ATACH: 6
TZST-0001FASTVT: 5
TZST-0001FASTVT: 6
TZST-0001SLOWVT: 4
TZST-0001SLOWVT: 6
TZST-0002ATACH: NEGATIVE
TZST-0002FASTVT: NEGATIVE
TZST-0002FASTVT: NEGATIVE
TZST-0002FASTVT: NEGATIVE
TZST-0002FASTVT: NEGATIVE
TZST-0002SLOWVT: NEGATIVE
TZST-0002SLOWVT: NEGATIVE
TZST-0002SLOWVT: NEGATIVE

## 2011-01-25 NOTE — Assessment & Plan Note (Signed)
There has been no intercurrent ventricular tachycardia; I am concerned that amiodarone is contributing to his pulmonary issues and we will discontinue it at this time

## 2011-01-25 NOTE — Assessment & Plan Note (Signed)
The patient's device was interrogated.  The information was reviewed. No changes were made in the programming.    

## 2011-01-25 NOTE — Progress Notes (Signed)
HPI  Robert [name] is a 60 y.o. male Seen in followup for ICD implanted in the setting of  ischemic heart disease with depressed left ventricular function.His ejection fraction was 20-25% by echocardiogram in May 2010. He is status post anterior STEMI and was found at catheterization to occluded his LAD stent. (This is my best inference from reading catheter note).At that time he underwent insertion of balloon pump and was transferred to [facility]. An ICD was implanted. There is a suggestion of the old notes that this is a placed for secondary prevention. The patient recalls having been told he had a problem for which an ablation or defibrillator would be appropriate; at that time he was intubated and lying at [facility].   He was put on amiodarone at that time and the dose was reduced subsequent to that.  I should note that he was at [facility] for 3-4 months intubated and comatose for more than a month.  He has had problems with recurrent orthostatic syncope and has recorded his blood pressure at home with these episodes in the 50 range. Reduction of his diuretics has been associated with some improvement.  He has been better since then with less dizziness.  The last few months have been notable for a couple of hospitalizations for respiratory failure. A recent echo by Dr. Welton Flakes showed an EF of 10-15%.  He has been referred to [facility] for a heart-lung transplant. He is on chronic oxygen currently at 4-1/2 L with O2 sats in the 85-95% range His QRS duration last month by ECG was 104 ms.   Past Medical History  Diagnosis Date  . Obstructive emphysema   . Systolic heart failure, chronic   . Heart failure, diastolic, chronic   . CAD (coronary artery disease)   . Coronary atherosclerosis     Of native coronary artery  . MI (myocardial infarction)     Stent x2    Past Surgical History  Procedure Date  . Coronary stent placement 06/2008    CAD/MI, stents x2  . Cardiac defibrillator placement 06/2008  .  Hernia repair 1999    Double    Current Outpatient Prescriptions  Medication Sig Dispense Refill  . albuterol (VENTOLIN HFA) 108 (90 BASE) MCG/ACT inhaler Inhale 2 puffs into the lungs daily.        Marland Kitchen ALPRAZolam (XANAX) 0.25 MG tablet Take 1 tablet (0.25 mg total) by mouth 2 (two) times daily as needed.  30 tablet  0  . aspirin 81 MG tablet Take 81 mg by mouth daily.        Marland Kitchen atorvastatin (LIPITOR) 40 MG tablet Take 40 mg by mouth daily.        . carvedilol (COREG) 6.25 MG tablet Take 1 tablet (6.25 mg total) by mouth every 12 (twelve) hours.  60 tablet  11  . clopidogrel (PLAVIX) 75 MG tablet Take 75 mg by mouth daily.        . furosemide (LASIX) 40 MG tablet Take 40 mg by mouth daily.       Marland Kitchen lisinopril (PRINIVIL,ZESTRIL) 5 MG tablet Take 2.5 mg by mouth every other day.        . mometasone-formoterol (DULERA) 100-5 MCG/ACT AERO Inhale into the lungs as needed.        . nitroGLYCERIN (NITROSTAT) 0.4 MG SL tablet Place 0.4 mg under the tongue every 5 (five) minutes as needed. May repeat for up to 3 doses.       . Omega-3 Fatty Acids (  OMEGA-3 FISH OIL) 1200 MG CAPS Take 1 capsule by mouth daily.        Marland Kitchen omeprazole (PRILOSEC) 20 MG capsule Take 20 mg by mouth 2 (two) times daily.        . sertraline (ZOLOFT) 50 MG tablet Take 50 mg by mouth daily.        Marland Kitchen spironolactone (ALDACTONE) 25 MG tablet Take 12.5 mg by mouth daily.        . tadalafil (CIALIS) 5 MG tablet Take 5 mg by mouth daily.        Marland Kitchen tiotropium (SPIRIVA) 18 MCG inhalation capsule Place 18 mcg into inhaler and inhale.        . zolpidem (AMBIEN) 5 MG tablet Take 5 mg by mouth at bedtime as needed.          No Known Allergies  Review of Systems negative except from HPI and PMH  Physical Exam Well developed and well nourished in mild respiratory distress wearing oxygenHENT normal E scleral and icterus clear Neck Supple JVP flat; carotids brisk and full Decreased breath sounds Regular rate and rhythm, no murmurs gallops or  rub Soft with active bowel sounds No clubbing cyanosisno Edema Alert and oriented, grossly normal motor and sensory function Skin Warm and Dry   Assessment and  Plan

## 2011-01-25 NOTE — Assessment & Plan Note (Signed)
No acute coronary issues; records from [facility] are not available

## 2011-02-06 ENCOUNTER — Inpatient Hospital Stay: Payer: Medicare Other | Admitting: Internal Medicine

## 2011-02-07 DIAGNOSIS — J9600 Acute respiratory failure, unspecified whether with hypoxia or hypercapnia: Secondary | ICD-10-CM

## 2011-02-08 DIAGNOSIS — I5023 Acute on chronic systolic (congestive) heart failure: Secondary | ICD-10-CM

## 2011-02-08 DIAGNOSIS — I2510 Atherosclerotic heart disease of native coronary artery without angina pectoris: Secondary | ICD-10-CM

## 2011-02-09 ENCOUNTER — Ambulatory Visit: Payer: Self-pay | Admitting: Internal Medicine

## 2011-02-09 HISTORY — PX: TRACHEOSTOMY TUBE PLACEMENT: SHX814

## 2011-02-09 HISTORY — PX: PEG TUBE PLACEMENT: SUR1034

## 2011-02-09 LAB — CBC WITH DIFFERENTIAL/PLATELET
Eosinophil %: 0 %
HCT: 36.3 % — ABNORMAL LOW (ref 40.0–52.0)
Lymphocyte %: 3.2 %
Monocyte %: 3.3 %
Neutrophil #: 15.9 10*3/uL — ABNORMAL HIGH (ref 1.4–6.5)
Platelet: 227 10*3/uL (ref 150–440)
RBC: 4.02 10*6/uL — ABNORMAL LOW (ref 4.40–5.90)
RDW: 18.3 % — ABNORMAL HIGH (ref 11.5–14.5)
WBC: 17 10*3/uL — ABNORMAL HIGH (ref 3.8–10.6)

## 2011-02-09 LAB — BASIC METABOLIC PANEL
Anion Gap: 11 (ref 7–16)
BUN: 37 mg/dL — ABNORMAL HIGH (ref 7–18)
Chloride: 110 mmol/L — ABNORMAL HIGH (ref 98–107)
Creatinine: 1.69 mg/dL — ABNORMAL HIGH (ref 0.60–1.30)
EGFR (Non-African Amer.): 45 — ABNORMAL LOW
Glucose: 149 mg/dL — ABNORMAL HIGH (ref 65–99)
Osmolality: 304 (ref 275–301)

## 2011-02-10 LAB — CBC WITH DIFFERENTIAL/PLATELET
Basophil #: 0 10*3/uL (ref 0.0–0.1)
Lymphocyte #: 0.4 10*3/uL — ABNORMAL LOW (ref 1.0–3.6)
MCH: 29 pg (ref 26.0–34.0)
MCV: 91 fL (ref 80–100)
Monocyte #: 0.3 10*3/uL (ref 0.0–0.7)
Neutrophil %: 94.2 %
Platelet: 197 10*3/uL (ref 150–440)
RBC: 3.88 10*6/uL — ABNORMAL LOW (ref 4.40–5.90)
RDW: 18 % — ABNORMAL HIGH (ref 11.5–14.5)

## 2011-02-10 LAB — BASIC METABOLIC PANEL
Anion Gap: 13 (ref 7–16)
Creatinine: 1.52 mg/dL — ABNORMAL HIGH (ref 0.60–1.30)
EGFR (African American): >60
EGFR (Non-African Amer.): 50 — ABNORMAL LOW
Osmolality: 310 (ref 275–301)

## 2011-02-11 LAB — BASIC METABOLIC PANEL
BUN: 49 mg/dL — ABNORMAL HIGH (ref 7–18)
Chloride: 110 mmol/L — ABNORMAL HIGH (ref 98–107)
Co2: 27 mmol/L (ref 21–32)
Glucose: 203 mg/dL — ABNORMAL HIGH (ref 65–99)
Potassium: 3.8 mmol/L (ref 3.5–5.1)
Sodium: 148 mmol/L — ABNORMAL HIGH (ref 136–145)

## 2011-02-11 LAB — CBC WITH DIFFERENTIAL/PLATELET
Basophil #: 0 10*3/uL (ref 0.0–0.1)
Eosinophil #: 0 10*3/uL (ref 0.0–0.7)
Lymphocyte #: 0.5 10*3/uL — ABNORMAL LOW (ref 1.0–3.6)
MCHC: 32.8 g/dL (ref 32.0–36.0)
MCV: 90 fL (ref 80–100)
Neutrophil #: 8.1 10*3/uL — ABNORMAL HIGH (ref 1.4–6.5)
Platelet: 154 10*3/uL (ref 150–440)
RBC: 4.05 10*6/uL — ABNORMAL LOW (ref 4.40–5.90)
RDW: 18.7 % — ABNORMAL HIGH (ref 11.5–14.5)
WBC: 9.2 10*3/uL (ref 3.8–10.6)

## 2011-02-11 LAB — MAGNESIUM: Magnesium: 2.2 mg/dL

## 2011-02-12 ENCOUNTER — Ambulatory Visit: Payer: Medicare Other | Admitting: Cardiovascular Disease

## 2011-02-12 LAB — BASIC METABOLIC PANEL
Calcium, Total: 8.1 mg/dL — ABNORMAL LOW (ref 8.5–10.1)
Co2: 29 mmol/L (ref 21–32)
EGFR (African American): >60
Glucose: 215 mg/dL — ABNORMAL HIGH (ref 65–99)
Osmolality: 306 (ref 275–301)
Sodium: 145 mmol/L (ref 136–145)

## 2011-02-12 LAB — CBC WITH DIFFERENTIAL/PLATELET
Basophil %: 0.3 %
Eosinophil %: 0 %
HCT: 35.8 % — ABNORMAL LOW (ref 40.0–52.0)
HGB: 11.6 g/dL — ABNORMAL LOW (ref 13.0–18.0)
Lymphocyte #: 0.4 10*3/uL — ABNORMAL LOW (ref 1.0–3.6)
Lymphocyte %: 3.9 %
MCV: 91 fL (ref 80–100)
Monocyte %: 4.2 %
Neutrophil #: 9.3 10*3/uL — ABNORMAL HIGH (ref 1.4–6.5)
RBC: 3.94 10*6/uL — ABNORMAL LOW (ref 4.40–5.90)
RDW: 17.9 % — ABNORMAL HIGH (ref 11.5–14.5)
WBC: 10.1 10*3/uL (ref 3.8–10.6)

## 2011-02-13 LAB — CBC WITH DIFFERENTIAL/PLATELET
Basophil #: 0 10*3/uL (ref 0.0–0.1)
Lymphocyte #: 0.5 10*3/uL — ABNORMAL LOW (ref 1.0–3.6)
Lymphocyte %: 3.3 %
MCV: 90 fL (ref 80–100)
Monocyte %: 4.7 %
Neutrophil %: 91.9 %
Platelet: 149 10*3/uL — ABNORMAL LOW (ref 150–440)
RBC: 3.95 10*6/uL — ABNORMAL LOW (ref 4.40–5.90)
RDW: 17.3 % — ABNORMAL HIGH (ref 11.5–14.5)
WBC: 16.4 10*3/uL — ABNORMAL HIGH (ref 3.8–10.6)

## 2011-02-13 LAB — BASIC METABOLIC PANEL
BUN: 38 mg/dL — ABNORMAL HIGH (ref 7–18)
Chloride: 106 mmol/L (ref 98–107)
Creatinine: 1.16 mg/dL (ref 0.60–1.30)
EGFR (Non-African Amer.): >60
Potassium: 3.5 mmol/L (ref 3.5–5.1)
Sodium: 146 mmol/L — ABNORMAL HIGH (ref 136–145)

## 2011-02-14 LAB — TROPONIN I: Troponin-I: 0.06 ng/mL — ABNORMAL HIGH

## 2011-02-14 LAB — URINALYSIS, COMPLETE
Bilirubin,UR: NEGATIVE
Hyaline Cast: 53
Ph: 5 (ref 4.5–8.0)
Protein: NEGATIVE
WBC UR: 2 /HPF (ref 0–5)

## 2011-02-14 LAB — CK TOTAL AND CKMB (NOT AT ARMC): CK-MB: 1.9 ng/mL (ref 0.5–3.6)

## 2011-02-15 LAB — TROPONIN I: Troponin-I: 0.12 ng/mL — ABNORMAL HIGH

## 2011-02-15 LAB — CBC WITH DIFFERENTIAL/PLATELET
Basophil %: 0 %
HCT: 35.6 % — ABNORMAL LOW (ref 40.0–52.0)
HGB: 11.3 g/dL — ABNORMAL LOW (ref 13.0–18.0)
Lymphocyte #: 0.2 10*3/uL — ABNORMAL LOW (ref 1.0–3.6)
Lymphocyte %: 0.8 %
MCV: 91 fL (ref 80–100)
Monocyte %: 3.9 %
Neutrophil #: 23.6 10*3/uL — ABNORMAL HIGH (ref 1.4–6.5)
WBC: 24.8 10*3/uL — ABNORMAL HIGH (ref 3.8–10.6)

## 2011-02-15 LAB — CK TOTAL AND CKMB (NOT AT ARMC)
CK, Total: 180 U/L (ref 35–232)
CK-MB: 0.8 ng/mL (ref 0.5–3.6)

## 2011-02-15 LAB — BASIC METABOLIC PANEL
Anion Gap: 9 (ref 7–16)
Calcium, Total: 8.2 mg/dL — ABNORMAL LOW (ref 8.5–10.1)
Glucose: 225 mg/dL — ABNORMAL HIGH (ref 65–99)
Osmolality: 308 (ref 275–301)

## 2011-02-16 LAB — BASIC METABOLIC PANEL
Anion Gap: 6 — ABNORMAL LOW (ref 7–16)
Calcium, Total: 8.1 mg/dL — ABNORMAL LOW (ref 8.5–10.1)
Chloride: 103 mmol/L (ref 98–107)
Co2: 38 mmol/L — ABNORMAL HIGH (ref 21–32)
EGFR (African American): >60
Osmolality: 311 (ref 275–301)

## 2011-02-16 LAB — HEPATIC FUNCTION PANEL A (ARMC)
Albumin: 2.2 g/dL — ABNORMAL LOW (ref 3.4–5.0)
Bilirubin,Total: 0.5 mg/dL (ref 0.2–1.0)
SGOT(AST): 15 U/L (ref 15–37)
Total Protein: 5.2 g/dL — ABNORMAL LOW (ref 6.4–8.2)

## 2011-02-16 LAB — CBC WITH DIFFERENTIAL/PLATELET
Basophil %: 0 %
Eosinophil %: 0 %
Lymphocyte #: 0.4 10*3/uL — ABNORMAL LOW (ref 1.0–3.6)
MCH: 29.5 pg (ref 26.0–34.0)
MCV: 92 fL (ref 80–100)
Monocyte %: 3.3 %
Platelet: 132 10*3/uL — ABNORMAL LOW (ref 150–440)
RBC: 3.43 10*6/uL — ABNORMAL LOW (ref 4.40–5.90)
WBC: 19.6 10*3/uL — ABNORMAL HIGH (ref 3.8–10.6)

## 2011-02-17 LAB — CBC WITH DIFFERENTIAL/PLATELET
HCT: 32.7 % — ABNORMAL LOW (ref 40.0–52.0)
MCV: 91 fL (ref 80–100)
Neutrophil %: 94.3 %
Platelet: 149 10*3/uL — ABNORMAL LOW (ref 150–440)
RBC: 3.58 10*6/uL — ABNORMAL LOW (ref 4.40–5.90)
WBC: 23.4 10*3/uL — ABNORMAL HIGH (ref 3.8–10.6)

## 2011-02-17 LAB — CULTURE, BLOOD (SINGLE)

## 2011-02-17 LAB — BASIC METABOLIC PANEL
Anion Gap: 7 (ref 7–16)
BUN: 42 mg/dL — ABNORMAL HIGH (ref 7–18)
Calcium, Total: 8.4 mg/dL — ABNORMAL LOW (ref 8.5–10.1)
EGFR (Non-African Amer.): >60
Glucose: 196 mg/dL — ABNORMAL HIGH (ref 65–99)
Osmolality: 310 (ref 275–301)
Potassium: 4.8 mmol/L (ref 3.5–5.1)

## 2011-02-18 LAB — BASIC METABOLIC PANEL
Anion Gap: 8 (ref 7–16)
BUN: 36 mg/dL — ABNORMAL HIGH (ref 7–18)
Co2: 38 mmol/L — ABNORMAL HIGH (ref 21–32)
Creatinine: 0.97 mg/dL (ref 0.60–1.30)
EGFR (African American): >60
Sodium: 150 mmol/L — ABNORMAL HIGH (ref 136–145)

## 2011-02-18 LAB — CBC WITH DIFFERENTIAL/PLATELET
Basophil %: 0.2 %
Eosinophil %: 0 %
HCT: 33.2 % — ABNORMAL LOW (ref 40.0–52.0)
HGB: 10.7 g/dL — ABNORMAL LOW (ref 13.0–18.0)
Lymphocyte #: 1 10*3/uL (ref 1.0–3.6)
MCV: 91 fL (ref 80–100)
Monocyte %: 3.9 %
Neutrophil #: 23.8 10*3/uL — ABNORMAL HIGH (ref 1.4–6.5)
Platelet: 186 10*3/uL (ref 150–440)
RBC: 3.66 10*6/uL — ABNORMAL LOW (ref 4.40–5.90)
WBC: 25.9 10*3/uL — ABNORMAL HIGH (ref 3.8–10.6)

## 2011-02-19 LAB — CBC WITH DIFFERENTIAL/PLATELET
Eosinophil %: 0 %
HCT: 30.8 % — ABNORMAL LOW (ref 40.0–52.0)
HGB: 9.9 g/dL — ABNORMAL LOW (ref 13.0–18.0)
Lymphocyte %: 1.8 %
MCV: 91 fL (ref 80–100)
Monocyte #: 0.4 10*3/uL (ref 0.0–0.7)
Monocyte %: 1.8 %
Neutrophil %: 95.7 %
Platelet: 158 10*3/uL (ref 150–440)
WBC: 20.3 10*3/uL — ABNORMAL HIGH (ref 3.8–10.6)

## 2011-02-19 LAB — BASIC METABOLIC PANEL
Anion Gap: 7 (ref 7–16)
EGFR (African American): >60
EGFR (Non-African Amer.): >60
Osmolality: 303 (ref 275–301)
Potassium: 4.4 mmol/L (ref 3.5–5.1)

## 2011-02-20 LAB — BASIC METABOLIC PANEL
Anion Gap: 7 (ref 7–16)
Chloride: 109 mmol/L — ABNORMAL HIGH (ref 98–107)
Chloride: 106 mmol/L (ref 98–107)
Creatinine: 1.04 mg/dL (ref 0.60–1.30)
Creatinine: 1.03 mg/dL (ref 0.60–1.30)
EGFR (African American): >60
EGFR (Non-African Amer.): >60
Potassium: 4.3 mmol/L (ref 3.5–5.1)
Sodium: 145 mmol/L (ref 136–145)

## 2011-02-20 LAB — CBC WITH DIFFERENTIAL/PLATELET
Basophil #: 0 10*3/uL (ref 0.0–0.1)
Basophil %: 0.2 %
Eosinophil #: 0.3 10*3/uL (ref 0.0–0.7)
HCT: 29.8 % — ABNORMAL LOW (ref 40.0–52.0)
HGB: 10.1 g/dL — ABNORMAL LOW (ref 13.0–18.0)
Lymphocyte #: 1.2 10*3/uL (ref 1.0–3.6)
Lymphocyte %: 5.7 %
MCH: 30.5 pg (ref 26.0–34.0)
MCHC: 33.9 g/dL (ref 32.0–36.0)
MCV: 90 fL (ref 80–100)
Monocyte #: 0.8 10*3/uL — ABNORMAL HIGH (ref 0.0–0.7)
Neutrophil #: 19 10*3/uL — ABNORMAL HIGH (ref 1.4–6.5)
RDW: 17.1 % — ABNORMAL HIGH (ref 11.5–14.5)

## 2011-02-20 LAB — EXPECTORATED SPUTUM ASSESSMENT W GRAM STAIN, RFLX TO RESP C

## 2011-02-21 LAB — COMPREHENSIVE METABOLIC PANEL
Anion Gap: 7 (ref 7–16)
Calcium, Total: 8.1 mg/dL — ABNORMAL LOW (ref 8.5–10.1)
Chloride: 106 mmol/L (ref 98–107)
Co2: 32 mmol/L (ref 21–32)
EGFR (African American): >60
EGFR (Non-African Amer.): >60
Osmolality: 295 (ref 275–301)
Potassium: 4.3 mmol/L (ref 3.5–5.1)
SGOT(AST): 15 U/L (ref 15–37)
SGPT (ALT): 25 U/L
Total Protein: 5.4 g/dL — ABNORMAL LOW (ref 6.4–8.2)

## 2011-02-21 LAB — CBC WITH DIFFERENTIAL/PLATELET
Basophil #: 0 10*3/uL (ref 0.0–0.1)
HCT: 29.3 % — ABNORMAL LOW (ref 40.0–52.0)
Lymphocyte %: 6.7 %
MCH: 29.4 pg (ref 26.0–34.0)
MCHC: 32.2 g/dL (ref 32.0–36.0)
Monocyte #: 0.4 10*3/uL (ref 0.0–0.7)
Monocyte %: 2.4 %
Neutrophil #: 13.4 10*3/uL — ABNORMAL HIGH (ref 1.4–6.5)
Neutrophil %: 87.1 %
Platelet: 179 10*3/uL (ref 150–440)
RDW: 18.2 % — ABNORMAL HIGH (ref 11.5–14.5)
WBC: 15.4 10*3/uL — ABNORMAL HIGH (ref 3.8–10.6)

## 2011-02-22 LAB — CBC WITH DIFFERENTIAL/PLATELET
Basophil #: 0 10*3/uL (ref 0.0–0.1)
Eosinophil #: 0.3 10*3/uL (ref 0.0–0.7)
HCT: 28.3 % — ABNORMAL LOW (ref 40.0–52.0)
Lymphocyte #: 1.3 10*3/uL (ref 1.0–3.6)
Lymphocyte %: 8.1 %
MCHC: 32 g/dL (ref 32.0–36.0)
MCV: 91 fL (ref 80–100)
Monocyte #: 0.5 10*3/uL (ref 0.0–0.7)
Monocyte %: 3 %
Neutrophil #: 14.1 10*3/uL — ABNORMAL HIGH (ref 1.4–6.5)
Platelet: 180 10*3/uL (ref 150–440)
RDW: 18.3 % — ABNORMAL HIGH (ref 11.5–14.5)
WBC: 16.2 10*3/uL — ABNORMAL HIGH (ref 3.8–10.6)

## 2011-02-23 LAB — CBC WITH DIFFERENTIAL/PLATELET
Eosinophil #: 0.3 10*3/uL (ref 0.0–0.7)
Eosinophil %: 1.7 %
HCT: 28.1 % — ABNORMAL LOW (ref 40.0–52.0)
HGB: 9.1 g/dL — ABNORMAL LOW (ref 13.0–18.0)
Lymphocyte #: 1.4 10*3/uL (ref 1.0–3.6)
Lymphocyte %: 9.2 %
MCH: 29.1 pg (ref 26.0–34.0)
MCHC: 32.4 g/dL (ref 32.0–36.0)
Neutrophil %: 85.3 %
Platelet: 195 10*3/uL (ref 150–440)
RBC: 3.13 10*6/uL — ABNORMAL LOW (ref 4.40–5.90)
WBC: 15.5 10*3/uL — ABNORMAL HIGH (ref 3.8–10.6)

## 2011-02-24 LAB — CBC WITH DIFFERENTIAL/PLATELET
Basophil #: 0 10*3/uL (ref 0.0–0.1)
Basophil %: 0.1 %
Eosinophil #: 0.3 10*3/uL (ref 0.0–0.7)
Eosinophil %: 1.4 %
HGB: 9.8 g/dL — ABNORMAL LOW (ref 13.0–18.0)
Lymphocyte %: 7.4 %
MCH: 30.2 pg (ref 26.0–34.0)
MCV: 89 fL (ref 80–100)
Neutrophil #: 15.7 10*3/uL — ABNORMAL HIGH (ref 1.4–6.5)
Platelet: 234 10*3/uL (ref 150–440)
RBC: 3.26 10*6/uL — ABNORMAL LOW (ref 4.40–5.90)
RDW: 16.2 % — ABNORMAL HIGH (ref 11.5–14.5)

## 2011-02-24 LAB — BASIC METABOLIC PANEL
BUN: 14 mg/dL (ref 7–18)
Co2: 31 mmol/L (ref 21–32)
Creatinine: 0.82 mg/dL (ref 0.60–1.30)
EGFR (Non-African Amer.): >60
Glucose: 106 mg/dL — ABNORMAL HIGH (ref 65–99)
Osmolality: 286 (ref 275–301)
Potassium: 3.7 mmol/L (ref 3.5–5.1)
Sodium: 143 mmol/L (ref 136–145)

## 2011-02-25 LAB — CBC WITH DIFFERENTIAL/PLATELET
Basophil #: 0.1 10*3/uL (ref 0.0–0.1)
Basophil %: 0.3 %
Eosinophil #: 0.2 10*3/uL (ref 0.0–0.7)
Eosinophil %: 1.1 %
HCT: 28.7 % — ABNORMAL LOW (ref 40.0–52.0)
Lymphocyte #: 1.1 10*3/uL (ref 1.0–3.6)
MCH: 29.4 pg (ref 26.0–34.0)
MCHC: 32.8 g/dL (ref 32.0–36.0)
MCV: 90 fL (ref 80–100)
Monocyte #: 0.7 10*3/uL (ref 0.0–0.7)
Monocyte %: 4.3 %
Neutrophil #: 14.8 10*3/uL — ABNORMAL HIGH (ref 1.4–6.5)
RBC: 3.21 10*6/uL — ABNORMAL LOW (ref 4.40–5.90)
RDW: 17.8 % — ABNORMAL HIGH (ref 11.5–14.5)

## 2011-02-25 LAB — BASIC METABOLIC PANEL
Anion Gap: 13 (ref 7–16)
BUN: 14 mg/dL (ref 7–18)
Calcium, Total: 8.5 mg/dL (ref 8.5–10.1)
EGFR (African American): >60
EGFR (Non-African Amer.): >60
Glucose: 107 mg/dL — ABNORMAL HIGH (ref 65–99)
Osmolality: 288 (ref 275–301)
Potassium: 3.9 mmol/L (ref 3.5–5.1)

## 2011-02-26 ENCOUNTER — Ambulatory Visit (HOSPITAL_COMMUNITY)
Admission: AD | Admit: 2011-02-26 | Discharge: 2011-02-26 | Disposition: A | Discharge status: Home / Self Care | Payer: Medicare Other | Source: Other Acute Inpatient Hospital | Attending: Internal Medicine | Admitting: Internal Medicine

## 2011-02-26 DIAGNOSIS — J189 Pneumonia, unspecified organism: Secondary | ICD-10-CM | POA: Insufficient documentation

## 2011-02-26 DIAGNOSIS — J9600 Acute respiratory failure, unspecified whether with hypoxia or hypercapnia: Secondary | ICD-10-CM | POA: Insufficient documentation

## 2011-02-26 DIAGNOSIS — J441 Chronic obstructive pulmonary disease with (acute) exacerbation: Secondary | ICD-10-CM | POA: Insufficient documentation

## 2011-02-26 DIAGNOSIS — I509 Heart failure, unspecified: Secondary | ICD-10-CM | POA: Insufficient documentation

## 2011-02-26 LAB — CBC WITH DIFFERENTIAL/PLATELET
Basophil #: 0 10*3/uL (ref 0.0–0.1)
Eosinophil %: 2.2 %
HGB: 9.2 g/dL — ABNORMAL LOW (ref 13.0–18.0)
Lymphocyte #: 1.5 10*3/uL (ref 1.0–3.6)
Lymphocyte %: 12.2 %
MCH: 29.4 pg (ref 26.0–34.0)
MCV: 90 fL (ref 80–100)
Monocyte %: 7.3 %
Neutrophil #: 9.7 10*3/uL — ABNORMAL HIGH (ref 1.4–6.5)
RBC: 3.14 10*6/uL — ABNORMAL LOW (ref 4.40–5.90)
RDW: 17.2 % — ABNORMAL HIGH (ref 11.5–14.5)

## 2011-02-28 LAB — BETA STREP CULTURE(ARMC)

## 2011-03-12 ENCOUNTER — Ambulatory Visit: Payer: Self-pay | Admitting: Internal Medicine

## 2011-04-29 ENCOUNTER — Encounter: Payer: Medicare Other | Admitting: *Deleted

## 2011-05-05 ENCOUNTER — Encounter: Payer: Self-pay | Admitting: *Deleted

## 2011-06-15 ENCOUNTER — Encounter: Payer: Self-pay | Admitting: Cardiovascular Disease

## 2011-06-15 ENCOUNTER — Ambulatory Visit (INDEPENDENT_AMBULATORY_CARE_PROVIDER_SITE_OTHER): Payer: Medicare Other | Admitting: Cardiovascular Disease

## 2011-06-15 VITALS — BP 100/68 | HR 83 | Ht 69.0 in | Wt 146.0 lb

## 2011-06-15 DIAGNOSIS — I5022 Chronic systolic (congestive) heart failure: Secondary | ICD-10-CM

## 2011-06-15 DIAGNOSIS — Z95810 Presence of automatic (implantable) cardiac defibrillator: Secondary | ICD-10-CM

## 2011-06-15 DIAGNOSIS — I2510 Atherosclerotic heart disease of native coronary artery without angina pectoris: Secondary | ICD-10-CM

## 2011-06-15 DIAGNOSIS — E785 Hyperlipidemia, unspecified: Secondary | ICD-10-CM

## 2011-06-15 NOTE — Patient Instructions (Signed)
You are doing well. No medication changes were made.  Please call us if you have new issues that need to be addressed before your next appt.  Your physician wants you to follow-up in: 6 months.  You will receive a reminder letter in the mail two months in advance. If you don't receive a letter, please call our office to schedule the follow-up appointment.   

## 2011-06-15 NOTE — Assessment & Plan Note (Signed)
He appears to be relatively euvolemic on today's visit. No medication changes were made.

## 2011-06-15 NOTE — Assessment & Plan Note (Signed)
Currently with no symptoms of angina. No further workup at this time. Continue current medication regimen. 

## 2011-06-15 NOTE — Assessment & Plan Note (Signed)
We have suggested he continue on his statin. Goal LDL less than 70. 

## 2011-06-15 NOTE — Assessment & Plan Note (Signed)
He is followed by Dr. Graciela Husbands at [facility].

## 2011-06-15 NOTE — Progress Notes (Signed)
Patient ID: Robert [name], male    DOB: July 10, 1950, 60 y.o.   MRN: [id number]  HPI Comments: Robert [name] has a hx of CAD,  30 year smoking history until 2010, as well as exposure to high-dose toxins including Freon, anterior STEMI with shock requiring balloon pump support, hospitalization at [facility] lasted 3-4 months during at least a large part of the time he was comatose, orthostatic syncope, history of an ICD implanted for secondary prevention at [facility],  treatment with amiodarone,  stress test at [facility] showing diffuse decreased uptake both at stress and rest. No clear ischemia noted,  admission to [facility] for shortness of breath, found to be in SVT with rate 154 beats per minute, intubated for respiratory support, treated with gentle diuresis and dopamine for blood pressure support, who had a cardiac catheterization by Dr. Sherlie Ban several days later showing patent proximal LAD stents with 75% disease at the distal segment which was unchanged from prior cardiac catheterization in June 2012.   Since his last clinic visit, he was admitted to [facility] with respiratory failure, possible pneumonia with a long hospital course requiring tracheostomy. He had transferred to a long-term pulmonary rehabilitation facility and was finally discharged early April. Tracheostomy has since been removed. He has oxygen at home but has not used this for short distances.  He reports that he was evaluated at [facility] for heart-lung transplant. It was recommended to him that he discuss lung transplant only but this appointment was never made for him and he has not pursued this at this time. he states that he is doing well. He denies any further symptoms of shortness of breath.    EKG today shows normal sinus rhythm with rate 83 beats per minute with poor R-wave progression through the precordial leads, old anterior MI Digoxin level in March 2013 1.0. Creatinine 1.45 Chest x-ray March 2013 showing opacity at the right lung base which could  represent infiltrate      Outpatient Encounter Prescriptions as of 06/15/2011  Medication Sig Dispense Refill  . albuterol (VENTOLIN HFA) 108 (90 BASE) MCG/ACT inhaler Inhale 2 puffs into the lungs daily.        Marland Kitchen ALPRAZolam (XANAX) 0.25 MG tablet Take 1 tablet (0.25 mg total) by mouth 2 (two) times daily as needed.  30 tablet  0  . aspirin 81 MG tablet Take 81 mg by mouth daily.        Marland Kitchen atorvastatin (LIPITOR) 40 MG tablet Take 40 mg by mouth daily.        . carvedilol (COREG) 6.25 MG tablet Take 3.125 mg by mouth daily.      . Cholecalciferol (VITAMIN D) 2000 UNITS tablet Take 2,000 Units by mouth daily.      . clopidogrel (PLAVIX) 75 MG tablet Take 75 mg by mouth daily.        . digoxin (LANOXIN) 0.125 MG tablet Take 125 mcg by mouth daily.      . fluticasone (FLOVENT HFA) 110 MCG/ACT inhaler Inhale 2 puffs into the lungs 2 (two) times daily.      . furosemide (LASIX) 40 MG tablet Take 20 mg by mouth daily.       Marland Kitchen guaifenesin (ROBAFEN) 100 MG/5ML syrup Take 200 mg by mouth every 6 (six) hours as needed.      . Lactobacillus (BACID PO) Take 250 mg by mouth 3 (three) times daily.      Marland Kitchen lisinopril (PRINIVIL,ZESTRIL) 5 MG tablet Take 2.5 mg by mouth every other day.        Marland Kitchen  loperamide (IMODIUM) 2 MG capsule Take 2 mg by mouth 2 (two) times daily. Takes 2 caplets daily and 1 cap after every loose stool.      . Multiple Vitamins-Minerals (CERTAVITE/ANTIOXIDANTS PO) Take 18 mg by mouth daily.      . nitroGLYCERIN (NITROSTAT) 0.4 MG SL tablet Place 0.4 mg under the tongue every 5 (five) minutes as needed. May repeat for up to 3 doses.       Marland Kitchen omeprazole (PRILOSEC) 20 MG capsule Take 20 mg by mouth 2 (two) times daily.        . ondansetron (ZOFRAN-ODT) 4 MG disintegrating tablet Take 4 mg by mouth every 8 (eight) hours as needed.      . potassium chloride (KLOR-CON) 20 MEQ packet Take 20 mEq by mouth daily.      . sertraline (ZOLOFT) 50 MG tablet Take 100 mg by mouth daily.       Marland Kitchen  spironolactone (ALDACTONE) 25 MG tablet Take 25 mg by mouth daily.       Marland Kitchen zolpidem (AMBIEN) 5 MG tablet Take 5 mg by mouth at bedtime as needed.        . tadalafil (CIALIS) 5 MG tablet Take 5 mg by mouth daily.         Review of Systems  Constitutional: Negative.   HENT: Negative.   Eyes: Negative.   Respiratory: Positive for shortness of breath.   Cardiovascular: Negative.   Gastrointestinal: Negative.   Musculoskeletal: Negative.   Skin: Negative.   Neurological: Negative.   Hematological: Negative.   Psychiatric/Behavioral: Negative.   All other systems reviewed and are negative.    BP 100/68  Pulse 83  Ht 5\' 9"  (1.753 m)  Wt 146 lb (66.225 kg)  BMI 21.56 kg/m2  Physical Exam  Nursing note and vitals reviewed. Constitutional: He is oriented to person, place, and time. He appears well-developed and well-nourished.  HENT:  Head: Normocephalic.  Nose: Nose normal.  Mouth/Throat: Oropharynx is clear and moist.  Eyes: Conjunctivae are normal. Pupils are equal, round, and reactive to light.  Neck: Normal range of motion. Neck supple. No JVD present.  Cardiovascular: Normal rate, regular rhythm, S1 normal, S2 normal and intact distal pulses.  Exam reveals no gallop and no friction rub.   Murmur heard.  Crescendo systolic murmur is present with a grade of 2/6  Pulmonary/Chest: Effort normal. No respiratory distress. He has decreased breath sounds. He has no wheezes. He has no rales. He exhibits no tenderness.  Abdominal: Soft. Bowel sounds are normal. He exhibits no distension. There is no tenderness.  Musculoskeletal: Normal range of motion. He exhibits no edema and no tenderness.  Lymphadenopathy:    He has no cervical adenopathy.  Neurological: He is alert and oriented to person, place, and time. Coordination normal.  Skin: Skin is warm and dry. No rash noted. No erythema.  Psychiatric: He has a normal mood and affect. His behavior is normal. Judgment and thought content  normal.           Assessment and Plan

## 2011-07-05 ENCOUNTER — Encounter: Payer: Self-pay | Admitting: *Deleted

## 2011-07-11 ENCOUNTER — Encounter (HOSPITAL_COMMUNITY): Payer: Self-pay | Admitting: *Deleted

## 2011-07-11 ENCOUNTER — Emergency Department: Payer: Self-pay | Admitting: *Deleted

## 2011-07-11 ENCOUNTER — Inpatient Hospital Stay (HOSPITAL_COMMUNITY): Payer: Medicare Other

## 2011-07-11 DIAGNOSIS — J449 Chronic obstructive pulmonary disease, unspecified: Secondary | ICD-10-CM | POA: Diagnosis present

## 2011-07-11 DIAGNOSIS — E785 Hyperlipidemia, unspecified: Secondary | ICD-10-CM | POA: Diagnosis present

## 2011-07-11 DIAGNOSIS — I509 Heart failure, unspecified: Secondary | ICD-10-CM | POA: Diagnosis present

## 2011-07-11 DIAGNOSIS — J4489 Other specified chronic obstructive pulmonary disease: Secondary | ICD-10-CM | POA: Diagnosis present

## 2011-07-11 DIAGNOSIS — N179 Acute kidney failure, unspecified: Secondary | ICD-10-CM | POA: Diagnosis present

## 2011-07-11 DIAGNOSIS — I214 Non-ST elevation (NSTEMI) myocardial infarction: Principal | ICD-10-CM

## 2011-07-11 DIAGNOSIS — I2589 Other forms of chronic ischemic heart disease: Secondary | ICD-10-CM | POA: Insufficient documentation

## 2011-07-11 DIAGNOSIS — I472 Ventricular tachycardia, unspecified: Secondary | ICD-10-CM | POA: Insufficient documentation

## 2011-07-11 DIAGNOSIS — I471 Supraventricular tachycardia, unspecified: Secondary | ICD-10-CM | POA: Insufficient documentation

## 2011-07-11 DIAGNOSIS — I2789 Other specified pulmonary heart diseases: Secondary | ICD-10-CM | POA: Diagnosis present

## 2011-07-11 DIAGNOSIS — I2510 Atherosclerotic heart disease of native coronary artery without angina pectoris: Secondary | ICD-10-CM

## 2011-07-11 DIAGNOSIS — N189 Chronic kidney disease, unspecified: Secondary | ICD-10-CM | POA: Diagnosis present

## 2011-07-11 DIAGNOSIS — I5022 Chronic systolic (congestive) heart failure: Secondary | ICD-10-CM

## 2011-07-11 DIAGNOSIS — E876 Hypokalemia: Secondary | ICD-10-CM | POA: Diagnosis present

## 2011-07-11 DIAGNOSIS — R079 Chest pain, unspecified: Secondary | ICD-10-CM

## 2011-07-11 DIAGNOSIS — I4901 Ventricular fibrillation: Secondary | ICD-10-CM

## 2011-07-11 DIAGNOSIS — I5023 Acute on chronic systolic (congestive) heart failure: Secondary | ICD-10-CM

## 2011-07-11 DIAGNOSIS — I4729 Other ventricular tachycardia: Secondary | ICD-10-CM | POA: Diagnosis present

## 2011-07-11 DIAGNOSIS — Z95810 Presence of automatic (implantable) cardiac defibrillator: Secondary | ICD-10-CM | POA: Insufficient documentation

## 2011-07-11 DIAGNOSIS — Z87891 Personal history of nicotine dependence: Secondary | ICD-10-CM

## 2011-07-11 DIAGNOSIS — J9620 Acute and chronic respiratory failure, unspecified whether with hypoxia or hypercapnia: Secondary | ICD-10-CM

## 2011-07-11 HISTORY — DX: Presence of automatic (implantable) cardiac defibrillator: Z95.810

## 2011-07-11 HISTORY — DX: Pneumonia, unspecified organism: J18.9

## 2011-07-11 HISTORY — DX: Presence of cardiac pacemaker: Z95.0

## 2011-07-11 HISTORY — DX: Ischemic cardiomyopathy: I25.5

## 2011-07-11 HISTORY — DX: Major depressive disorder, single episode, unspecified: F32.9

## 2011-07-11 HISTORY — DX: Shortness of breath: R06.02

## 2011-07-11 HISTORY — DX: Anxiety disorder, unspecified: F41.9

## 2011-07-11 HISTORY — DX: Depression, unspecified: F32.A

## 2011-07-11 HISTORY — DX: Chronic kidney disease, unspecified: N18.9

## 2011-07-11 HISTORY — DX: Gastro-esophageal reflux disease without esophagitis: K21.9

## 2011-07-11 HISTORY — DX: Ventricular tachycardia: I47.2

## 2011-07-11 HISTORY — DX: Headache: R51

## 2011-07-11 HISTORY — DX: Ventricular tachycardia, unspecified: I47.20

## 2011-07-11 LAB — CBC
HCT: 34.3 % — ABNORMAL LOW (ref 40.0–52.0)
MCH: 27 pg (ref 26.0–34.0)
MCH: 26.6 pg (ref 26.0–34.0)
MCHC: 30.7 g/dL (ref 30.0–36.0)
Platelet: 271 10*3/uL (ref 150–440)
Platelets: 231 10*3/uL (ref 150–400)
RBC: 4.06 10*6/uL — ABNORMAL LOW (ref 4.40–5.90)
RDW: 18.3 % — ABNORMAL HIGH (ref 11.5–14.5)
RDW: 17.6 % — ABNORMAL HIGH (ref 11.5–15.5)
WBC: 14.6 10*3/uL — ABNORMAL HIGH (ref 3.8–10.6)

## 2011-07-11 LAB — COMPREHENSIVE METABOLIC PANEL
ALT: 12 U/L (ref 0–53)
AST: 28 U/L (ref 0–37)
Albumin: 3.3 g/dL — ABNORMAL LOW (ref 3.4–5.0)
Albumin: 3.5 g/dL (ref 3.5–5.2)
Alkaline Phosphatase: 114 U/L (ref 50–136)
BUN: 15 mg/dL (ref 7–18)
Calcium: 8.9 mg/dL (ref 8.4–10.5)
Chloride: 102 mmol/L (ref 98–107)
Co2: 30 mmol/L (ref 21–32)
Creatinine: 1.53 mg/dL — ABNORMAL HIGH (ref 0.60–1.30)
EGFR (African American): 57 — ABNORMAL LOW
GFR calc Af Amer: 69 mL/min — ABNORMAL LOW (ref >90)
Glucose, Bld: 251 mg/dL — ABNORMAL HIGH (ref 70–99)
Glucose: 165 mg/dL — ABNORMAL HIGH (ref 65–99)
Osmolality: 293 (ref 275–301)
Potassium: 2.7 mmol/L — ABNORMAL LOW (ref 3.5–5.1)
SGOT(AST): 24 U/L (ref 15–37)
SGPT (ALT): 17 U/L
Sodium: 140 mEq/L (ref 135–145)
Total Protein: 6.5 g/dL (ref 6.0–8.3)

## 2011-07-11 LAB — PROTIME-INR
INR: 1.2
Prothrombin Time: 15.1 secs — ABNORMAL HIGH (ref 11.5–14.7)

## 2011-07-11 LAB — CARDIAC PANEL(CRET KIN+CKTOT+MB+TROPI)
CK, MB: 23.1 ng/mL (ref 0.3–4.0)
CK, MB: 34.9 ng/mL (ref 0.3–4.0)
Relative Index: 10.2 — ABNORMAL HIGH (ref 0.0–2.5)
Total CK: 231 U/L (ref 7–232)
Total CK: 357 U/L — ABNORMAL HIGH (ref 7–232)
Total CK: 342 U/L — ABNORMAL HIGH (ref 7–232)
Troponin I: 7.18 ng/mL (ref <0.30)

## 2011-07-11 LAB — POCT I-STAT 3, ART BLOOD GAS (G3+)
O2 Saturation: 92 %
Patient temperature: 98.6
TCO2: 28 mmol/L (ref 0–100)
pCO2 arterial: 41.9 mmHg (ref 35.0–45.0)
pH, Arterial: 7.406 (ref 7.350–7.450)

## 2011-07-11 LAB — BASIC METABOLIC PANEL
Calcium: 8.9 mg/dL (ref 8.4–10.5)
Creatinine, Ser: 1.63 mg/dL — ABNORMAL HIGH (ref 0.50–1.35)
GFR calc Af Amer: 52 mL/min — ABNORMAL LOW (ref >90)
GFR calc non Af Amer: 45 mL/min — ABNORMAL LOW (ref >90)
Sodium: 139 mEq/L (ref 135–145)

## 2011-07-11 LAB — PRO B NATRIURETIC PEPTIDE
B-Type Natriuretic Peptide: 7736 pg/mL — ABNORMAL HIGH (ref 0–125)
Pro B Natriuretic peptide (BNP): 22914 pg/mL — ABNORMAL HIGH (ref 0–125)

## 2011-07-11 LAB — MRSA PCR SCREENING: MRSA by PCR: NEGATIVE

## 2011-07-11 LAB — CK TOTAL AND CKMB (NOT AT ARMC): CK, Total: 83 U/L (ref 35–232)

## 2011-07-11 MED ORDER — SODIUM CHLORIDE 0.9 % IJ SOLN
3.0000 mL | Freq: Two times a day (BID) | INTRAMUSCULAR | Status: DC
  Administered 2011-07-11 – 2011-07-13 (×3): 3 mL via INTRAVENOUS

## 2011-07-11 MED ORDER — HEPARIN SODIUM (PORCINE) 5000 UNIT/ML IJ SOLN
5000.0000 [IU] | Freq: Three times a day (TID) | INTRAMUSCULAR | Status: DC
  Administered 2011-07-11 – 2011-07-13 (×9): 5000 [IU] via SUBCUTANEOUS
  Filled 2011-07-11 (×13): qty 1

## 2011-07-11 MED ORDER — SODIUM CHLORIDE 0.9 % IV SOLN: INTRAVENOUS | Status: DC

## 2011-07-11 MED ORDER — CLONAZEPAM 0.5 MG PO TABS
0.5000 mg | ORAL_TABLET | Freq: Once | ORAL | Status: AC
  Administered 2011-07-11: 0.5 mg via ORAL
  Filled 2011-07-11: qty 1

## 2011-07-11 MED ORDER — CLOPIDOGREL BISULFATE 75 MG PO TABS
75.0000 mg | ORAL_TABLET | Freq: Every day | ORAL | Status: DC
  Administered 2011-07-11 – 2011-07-13 (×3): 75 mg via ORAL
  Filled 2011-07-11 (×5): qty 1

## 2011-07-11 MED ORDER — FUROSEMIDE 20 MG PO TABS
20.0000 mg | ORAL_TABLET | Freq: Every day | ORAL | Status: DC
  Administered 2011-07-11 – 2011-07-12 (×2): 20 mg via ORAL
  Filled 2011-07-11 (×3): qty 1

## 2011-07-11 MED ORDER — VITAMIN D3 25 MCG (1000 UNIT) PO TABS
2000.0000 [IU] | ORAL_TABLET | Freq: Every day | ORAL | Status: DC
  Administered 2011-07-11 – 2011-07-13 (×3): 2000 [IU] via ORAL
  Filled 2011-07-11 (×5): qty 2

## 2011-07-11 MED ORDER — AMIODARONE HCL IN DEXTROSE 360-4.14 MG/200ML-% IV SOLN
0.5000 mg/min | INTRAVENOUS | Status: DC
  Administered 2011-07-11 – 2011-07-12 (×4): 0.5 mg/min via INTRAVENOUS
  Filled 2011-07-11 (×10): qty 200

## 2011-07-11 MED ORDER — ASPIRIN 81 MG PO TABS: 81.0000 mg | ORAL_TABLET | Freq: Every day | ORAL | Status: DC

## 2011-07-11 MED ORDER — POTASSIUM CHLORIDE CRYS ER 20 MEQ PO TBCR
EXTENDED_RELEASE_TABLET | ORAL | Status: AC
  Administered 2011-07-11: 40 meq via ORAL
  Filled 2011-07-11: qty 2

## 2011-07-11 MED ORDER — SODIUM CHLORIDE 0.9 % IJ SOLN: 3.0000 mL | INTRAMUSCULAR | Status: DC | PRN

## 2011-07-11 MED ORDER — SPIRONOLACTONE 25 MG PO TABS
25.0000 mg | ORAL_TABLET | Freq: Every day | ORAL | Status: DC
  Administered 2011-07-11 – 2011-07-13 (×3): 25 mg via ORAL
  Filled 2011-07-11 (×4): qty 1

## 2011-07-11 MED ORDER — ATORVASTATIN CALCIUM 40 MG PO TABS
40.0000 mg | ORAL_TABLET | Freq: Every day | ORAL | Status: DC
  Administered 2011-07-11 – 2011-07-13 (×3): 40 mg via ORAL
  Filled 2011-07-11 (×4): qty 1

## 2011-07-11 MED ORDER — ZOLPIDEM TARTRATE 5 MG PO TABS
5.0000 mg | ORAL_TABLET | Freq: Every evening | ORAL | Status: DC | PRN
  Administered 2011-07-12 – 2011-07-13 (×2): 5 mg via ORAL
  Filled 2011-07-11 (×2): qty 1

## 2011-07-11 MED ORDER — SODIUM CHLORIDE 0.9 % IJ SOLN: 3.0000 mL | Freq: Two times a day (BID) | INTRAMUSCULAR | Status: DC

## 2011-07-11 MED ORDER — SODIUM CHLORIDE 0.9 % IV SOLN
INTRAVENOUS | Status: DC | PRN
  Administered 2011-07-12: 13:00:00 via INTRAVENOUS

## 2011-07-11 MED ORDER — SERTRALINE HCL 100 MG PO TABS
100.0000 mg | ORAL_TABLET | Freq: Every day | ORAL | Status: DC
  Administered 2011-07-11 – 2011-07-13 (×3): 100 mg via ORAL
  Filled 2011-07-11 (×4): qty 1

## 2011-07-11 MED ORDER — ALBUTEROL SULFATE (5 MG/ML) 0.5% IN NEBU
2.5000 mg | INHALATION_SOLUTION | Freq: Four times a day (QID) | RESPIRATORY_TRACT | Status: DC
  Administered 2011-07-11 – 2011-07-13 (×11): 2.5 mg via RESPIRATORY_TRACT
  Filled 2011-07-11 (×11): qty 0.5

## 2011-07-11 MED ORDER — ASPIRIN 81 MG PO CHEW: 324.0000 mg | CHEWABLE_TABLET | ORAL | Status: DC

## 2011-07-11 MED ORDER — ASPIRIN EC 81 MG PO TBEC: 81.0000 mg | DELAYED_RELEASE_TABLET | Freq: Every day | ORAL | Status: DC

## 2011-07-11 MED ORDER — LISINOPRIL 2.5 MG PO TABS
2.5000 mg | ORAL_TABLET | ORAL | Status: DC
  Filled 2011-07-11 (×2): qty 1

## 2011-07-11 MED ORDER — TADALAFIL 5 MG PO TABS
5.0000 mg | ORAL_TABLET | Freq: Every day | ORAL | Status: DC
  Filled 2011-07-11: qty 1

## 2011-07-11 MED ORDER — ALPRAZOLAM 0.25 MG PO TABS
0.2500 mg | ORAL_TABLET | Freq: Two times a day (BID) | ORAL | Status: DC | PRN
  Administered 2011-07-11 – 2011-07-12 (×3): 0.25 mg via ORAL
  Filled 2011-07-11 (×4): qty 1

## 2011-07-11 MED ORDER — AMIODARONE HCL IN DEXTROSE 360-4.14 MG/200ML-% IV SOLN
1.0000 mg/min | INTRAVENOUS | Status: AC
  Administered 2011-07-11: 1 mg/min via INTRAVENOUS
  Filled 2011-07-11 (×2): qty 200

## 2011-07-11 MED ORDER — NITROGLYCERIN 0.4 MG SL SUBL: 0.4000 mg | SUBLINGUAL_TABLET | SUBLINGUAL | Status: DC | PRN

## 2011-07-11 MED ORDER — PANTOPRAZOLE SODIUM 40 MG PO TBEC
40.0000 mg | DELAYED_RELEASE_TABLET | Freq: Every day | ORAL | Status: DC
  Administered 2011-07-11 – 2011-07-12 (×2): 40 mg via ORAL
  Filled 2011-07-11 (×2): qty 1

## 2011-07-11 MED ORDER — ASPIRIN EC 81 MG PO TBEC
81.0000 mg | DELAYED_RELEASE_TABLET | Freq: Every day | ORAL | Status: DC
  Administered 2011-07-12 – 2011-07-13 (×2): 81 mg via ORAL
  Filled 2011-07-11 (×4): qty 1

## 2011-07-11 MED ORDER — FUROSEMIDE 10 MG/ML IJ SOLN
40.0000 mg | Freq: Once | INTRAMUSCULAR | Status: AC
  Administered 2011-07-11: 40 mg via INTRAVENOUS
  Filled 2011-07-11 (×2): qty 4

## 2011-07-11 MED ORDER — SODIUM CHLORIDE 0.9 % IV SOLN: 250.0000 mL | INTRAVENOUS | Status: DC | PRN

## 2011-07-11 MED ORDER — DIAZEPAM 5 MG PO TABS: 5.0000 mg | ORAL_TABLET | ORAL | Status: DC

## 2011-07-11 MED ORDER — ALBUTEROL SULFATE HFA 108 (90 BASE) MCG/ACT IN AERS
2.0000 | INHALATION_SPRAY | Freq: Every day | RESPIRATORY_TRACT | Status: DC
  Filled 2011-07-11: qty 6.7

## 2011-07-11 MED ORDER — POTASSIUM CHLORIDE CRYS ER 20 MEQ PO TBCR
40.0000 meq | EXTENDED_RELEASE_TABLET | Freq: Two times a day (BID) | ORAL | Status: AC
  Administered 2011-07-11 – 2011-07-12 (×4): 40 meq via ORAL
  Filled 2011-07-11 (×3): qty 2

## 2011-07-11 MED ORDER — CARVEDILOL 3.125 MG PO TABS
3.1250 mg | ORAL_TABLET | Freq: Every day | ORAL | Status: DC
  Administered 2011-07-11 – 2011-07-13 (×3): 3.125 mg via ORAL
  Filled 2011-07-11 (×4): qty 1

## 2011-07-11 MED ORDER — FLUTICASONE PROPIONATE HFA 110 MCG/ACT IN AERO
2.0000 | INHALATION_SPRAY | Freq: Two times a day (BID) | RESPIRATORY_TRACT | Status: DC
  Administered 2011-07-11 – 2011-07-13 (×6): 2 via RESPIRATORY_TRACT
  Filled 2011-07-11: qty 12

## 2011-07-11 NOTE — Progress Notes (Signed)
Robert Bottoms, MD, [facility] ABIM Board Certified in Adult Cardiovascular Medicine,Internal Medicine and Critical Care Medicine      Subjective:    Patient has had no recurrent ventricular tachycardia since hospitalization.  He feels weak and he reports decreased exercise tolerance over the last several weeks.patient reports shortness of breath on minimal exertion and he is in NYHA class 3/4.  He does not report any substernal chest pain however.   Objective:   Weight Range:  Vital Signs:   Temp:  [97.4 F (36.3 C)] 97.4 F (36.3 C) (06/02 0752) Pulse Rate:  [86-88] 87  (06/02 0815) Resp:  [22-26] 26  (06/02 0815) BP: (90-100)/(62-68) 90/63 mmHg (06/02 1000) SpO2:  [88 %-99 %] 99 % (06/02 1000) FiO2 (%):  [100 %] 100 % (06/02 0700) Weight:  [151 lb 7.3 oz (68.7 kg)] 151 lb 7.3 oz (68.7 kg) (06/02 0700)    Weight change: Filed Weights   07/22/2011 0700  Weight: 151 lb 7.3 oz (68.7 kg)    Intake/Output:   Intake/Output Summary (Last 24 hours) at 07/17/2011 1032 Last data filed at 07/10/2011 0950  Gross per 24 hour  Intake   33.3 ml  Output      0 ml  Net   33.3 ml     Physical Exam: General : pale-appearing male in no distress HEENT: Status post healed tracheostomy. Lungs decreased breath sounds bilaterally Heart: Regular rate and rhythm, normal S1 and S2 and S3 is present.  No definite pathological murmurs Abdomen: Not examined Extremity exam: No definite edema presently    Telemetry: normal sinus rhythm  Labs: Basic Metabolic Panel:  Lab 07/26/2011 1478  NA 140  K 3.3*  CL 100  CO2 24  GLUCOSE 251*  BUN 17  CREATININE 1.29  CALCIUM 8.9  MG 2.1  PHOS --    Liver Function Tests:  Lab 07/27/2011 0825  AST 28  ALT 12  ALKPHOS 106  BILITOT 0.3  PROT 6.5  ALBUMIN 3.5   No results found for this basename: LIPASE:5,AMYLASE:5 in the last 168 hours No results found for this basename: AMMONIA:3 in the last 168 hours  CBC:  Lab 07/27/2011 0825  WBC 14.9*    NEUTROABS --  HGB 11.2*  HCT 36.5*  MCV 86.7  PLT 231    Cardiac Enzymes:  Lab 07/29/2011 0825  CKTOTAL 231  CKMB 23.1*  CKMBINDEX --  TROPONINI 1.70*     BNP: BNP (last 3 results) No results found for this basename: PROBNP:3 in the last 8760 hours  ABG No results found for this basename: phart, pco2, pco2art, po2, po2art, hco3, tco2, acidbasedef, o2sat     Other results:  GNF:AOZHYQ sinus rhythm with incomplete right bundle branch block and ST-T wave changes with T wave inversion in anterior leads V1 through V3  Imaging: Dg Chest Port 1 View  07/12/2011  *RADIOLOGY REPORT*  Clinical Data: Short of breath  PORTABLE CHEST - 1 VIEW  Comparison: None  Findings: Left chest wall AICD is noted with lead in the right atrial appendage and right ventricle.  The heart size appears normal.  Bilateral pleural effusions and interstitial edema identified consistent with moderate CHF.  IMPRESSION:  1.  Moderate congestive heart failure .  Original Report Authenticated By: Rosealee Albee, M.D.      Medications:     Scheduled Medications:    . albuterol  2 puff Inhalation Daily  . albuterol  2.5 mg Nebulization Q6H  . aspirin EC  81 mg Oral Daily  . atorvastatin  40 mg Oral q1800  . carvedilol  3.125 mg Oral Daily  . cholecalciferol  2,000 Units Oral Daily  . clopidogrel  75 mg Oral QAC breakfast  . fluticasone  2 puff Inhalation BID  . furosemide  20 mg Oral Daily  . heparin  5,000 Units Subcutaneous Q8H  . lisinopril  2.5 mg Oral QODAY  . pantoprazole  40 mg Oral Q1200  . sertraline  100 mg Oral Daily  . sodium chloride  3 mL Intravenous Q12H  . spironolactone  25 mg Oral Daily  . DISCONTD: aspirin EC  81 mg Oral Daily  . DISCONTD: aspirin  81 mg Oral Daily  . DISCONTD: tadalafil  5 mg Oral Daily     Infusions:    . amiodarone (NEXTERONE PREMIX) 360 mg/200 mL dextrose 1 mg/min (2011/07/20 0950)   Followed by  . amiodarone (NEXTERONE PREMIX) 360 mg/200 mL dextrose        PRN Medications:  ALPRAZolam, nitroGLYCERIN, zolpidem   Assessment:   #1 VT storm multiple countershocks: Rule out ischemia #2 ischemic cardiomyopathy #3 status post anterior wall myocardial infarction status post stent to the LAD #4 severe lung disease with COPD and pulmonary-amiodarone discontinued in the past due to severe underlying lung disease hypertension- intubated 4 times in the last year #5 ventricular tachycardia #6 status post ICD implantation #7 acute and chronic systolic heart failure #8 pulmonary hypertension #9 hypokalemia #10 hypomagnesemia #11 status post prolonged hospitalization for respiratory failure earlier this year [4 months hospitalization both in [place] and at [place]) #12 status post catheterizationearlier this year by Dr. Lewie Loron : No intervention LAD stent patent #13 inverted T-wave changes anterior leads V1 through V3 #14.patient [place] in [place] from 2005 from 2010 exposed to diesel fumes and sand dust --> lung disease afterwards.   Patient Active Problem List  Diagnoses  . HYPERLIPIDEMIA-MIXED  . CAD, NATIVE VESSEL  . CARDIOMYOPATHY, ISCHEMIC  . VENTRICULAR TACHYCARDIA  . SYSTOLIC HEART FAILURE, CHRONIC  . HYPOTENSION, UNSPECIFIED  . AUTOMATIC IMPLANTABLE CARDIAC DEFIBRILLATOR SITU  . CHEST PAIN  . SVT (supraventricular tachycardia)      Plan/Discussion:    #1 EP service to make decision regarding long-term antiarrhythmic therapy although it appears that the amiodarone is likely the only option. #2 consider ischemia evaluation pending evaluation of the electrophysiology service #3 replace potassium #4 echocardiogram to reassess LV function and RV function.  Consider intravenous milrinone but at risk for increased ventricular tachycardia.  We will let heart failure service to side. #5 the patient has had no formal evaluation for LVad for heart transplantation, the latter is probably an unrealistic possibility due to  his underlying severe lung disease unless he receives a heart-lung transplant. #6we will let Dr. Clarise Cruz on the side if he could benefit from a right heart catheterization for assessment also of his pulmonary artery pressures Based on the EKG changes and positive troponins in the setting of ventricular tachycardia storm think the patient could benefit from a left and right heart catheterization to rule out an ischemic substrate. #7 consider ventricular tachycardia ablationdue to inability to take long-term amiodarone    Length of Stay: 0   Alvin Critchley [facility] 07/20/11, 10:32 AM

## 2011-07-11 NOTE — Progress Notes (Addendum)
PHARMACIST - PHYSICIAN COMMUNICATION DR:  Margo Aye, et al. CONCERNING:  Amiodarone drug-drug interactions   DESCRIPTION: From the patient's medication profile, significant interactions between amiodarone and Lipitor, digoxin, and sertraline have been noted. Digoxin currently has not been continued from home, but the patient has been taking at home. Also noted that patient has been taking amiodarone 100 mg daily at home.  1) Lipitor:  Amiodarone may decrease the metabolism of Lipitor increasing the risk for myalgia and rhabdomyolysis. Limiting the dose of Lipitor to 10 mg daily or changing to a non-interacting statin such as pravastatin (pravastatin 80 mg = Lipitor 20 mg) may be warranted.  2) Digoxin:  Amiodarone may increase the levels of digoxin. Though the patient is on amiodarone and digoxin concomitantly at home, it may be warranted to check a level and/or reduce the dose of digoxin if the patient will be on increased doses of amiodarone (infusion at 0.5 mg/min/day = 720 mg/day), especially upon discharge.  3) Sertraline:  There may be some added risk of QTc prolongation with the combined  use of sertraline and amiodarone. May warrant more frequent monitoring. QTc from [facility] noted to be 440 on ECG in chart.    RECOMMENDATION: 1) Limiting the dose of Lipitor to 10 mg daily or changing to a non-interacting statin such as pravastatin (pravastatin 80 mg = Lipitor 20 mg) may be warranted. 2) Check digoxin level and/or reduce the dose of digoxin if the patient will be on increased doses of amiodarone (infusion at 0.5 mg/min/day = 720 mg/day), especially upon discharge. 3) May warrant more frequent monitoring of QTc with concomitant sertaline    Of note there is also a drug-drug interaction between nitroglycerin and phosphodiesterase 5 inhibitors (tadalafil/cialis), with enhanced vasodilatory effects. Pt is on both the SL nitroglycerin tablets and tadalafil at home.   Thank you, Maudry Mayhew,  PharmD Pgr [phone]

## 2011-07-11 NOTE — Progress Notes (Signed)
Pt indicates he would like 100% NRB-states he is feeling anxious and short of breath.  Dr. Andee Lineman notified and orders noted.

## 2011-07-11 NOTE — Progress Notes (Signed)
Pt arrived on floor via [facility] at 0700am, on non -rebreather mask alert and oriented X4 denies any chest pain. Dr Margo Aye at bedside assessed pt. Writing new orders.V/S 99/55, 97, 25, 97% Report given to AM  RN.

## 2011-07-12 ENCOUNTER — Encounter (HOSPITAL_COMMUNITY): Admission: EM | Disposition: E | Payer: Self-pay | Source: Other Acute Inpatient Hospital | Attending: Cardiology

## 2011-07-12 ENCOUNTER — Inpatient Hospital Stay (HOSPITAL_COMMUNITY): Payer: Medicare Other

## 2011-07-12 ENCOUNTER — Encounter (HOSPITAL_COMMUNITY): Payer: Self-pay | Admitting: Internal Medicine

## 2011-07-12 DIAGNOSIS — I472 Ventricular tachycardia: Secondary | ICD-10-CM

## 2011-07-12 DIAGNOSIS — I214 Non-ST elevation (NSTEMI) myocardial infarction: Secondary | ICD-10-CM

## 2011-07-12 DIAGNOSIS — I5023 Acute on chronic systolic (congestive) heart failure: Secondary | ICD-10-CM

## 2011-07-12 DIAGNOSIS — J9620 Acute and chronic respiratory failure, unspecified whether with hypoxia or hypercapnia: Secondary | ICD-10-CM

## 2011-07-12 DIAGNOSIS — Z95810 Presence of automatic (implantable) cardiac defibrillator: Secondary | ICD-10-CM

## 2011-07-12 LAB — BASIC METABOLIC PANEL
BUN: 23 mg/dL (ref 6–23)
Chloride: 101 mEq/L (ref 96–112)
Creatinine, Ser: 1.6 mg/dL — ABNORMAL HIGH (ref 0.50–1.35)
GFR calc Af Amer: 53 mL/min — ABNORMAL LOW (ref >90)
GFR calc non Af Amer: 46 mL/min — ABNORMAL LOW (ref >90)

## 2011-07-12 SURGERY — LEFT AND RIGHT HEART CATHETERIZATION WITH CORONARY ANGIOGRAM
Anesthesia: LOCAL

## 2011-07-12 MED ORDER — ASPIRIN 81 MG PO CHEW
324.0000 mg | CHEWABLE_TABLET | ORAL | Status: AC
  Administered 2011-07-13: 324 mg via ORAL
  Filled 2011-07-12: qty 4

## 2011-07-12 MED ORDER — ALPRAZOLAM 0.25 MG PO TABS
0.2500 mg | ORAL_TABLET | Freq: Three times a day (TID) | ORAL | Status: DC | PRN
  Administered 2011-07-12: 0.25 mg via ORAL

## 2011-07-12 MED ORDER — ALPRAZOLAM 0.5 MG PO TABS
0.5000 mg | ORAL_TABLET | Freq: Two times a day (BID) | ORAL | Status: DC | PRN
  Administered 2011-07-12 – 2011-07-13 (×2): 0.5 mg via ORAL
  Filled 2011-07-12 (×2): qty 1

## 2011-07-12 MED ORDER — ALUM & MAG HYDROXIDE-SIMETH 200-200-20 MG/5ML PO SUSP
15.0000 mL | ORAL | Status: DC | PRN
  Filled 2011-07-12: qty 30

## 2011-07-12 MED ORDER — SODIUM CHLORIDE 0.9 % IV SOLN: 250.0000 mL | INTRAVENOUS | Status: DC | PRN

## 2011-07-12 MED ORDER — SODIUM CHLORIDE 0.9 % IJ SOLN
3.0000 mL | INTRAMUSCULAR | Status: DC | PRN
  Administered 2011-07-12 (×2): 3 mL via INTRAVENOUS

## 2011-07-12 MED ORDER — SODIUM CHLORIDE 0.9 % IV SOLN
1.0000 mL/kg/h | INTRAVENOUS | Status: DC
  Administered 2011-07-13: 1 mL/kg/h via INTRAVENOUS

## 2011-07-12 MED ORDER — SODIUM CHLORIDE 0.9 % IJ SOLN
3.0000 mL | Freq: Two times a day (BID) | INTRAMUSCULAR | Status: DC
  Administered 2011-07-12 – 2011-07-13 (×2): 3 mL via INTRAVENOUS

## 2011-07-12 MED ORDER — GUAIFENESIN-DM 100-10 MG/5ML PO SYRP: 15.0000 mL | ORAL_SOLUTION | ORAL | Status: DC | PRN

## 2011-07-12 MED ORDER — PANTOPRAZOLE SODIUM 40 MG PO TBEC
40.0000 mg | DELAYED_RELEASE_TABLET | Freq: Two times a day (BID) | ORAL | Status: DC
  Administered 2011-07-12 – 2011-07-13 (×3): 40 mg via ORAL
  Filled 2011-07-12 (×3): qty 1

## 2011-07-12 NOTE — Progress Notes (Signed)
Inpatient Diabetes Program Recommendations  AACE/ADA: New Consensus Statement on Inpatient Glycemic Control (2009)  Target Ranges:  Prepandial:   less than 140 mg/dL      Peak postprandial:   less than 180 mg/dL (1-2 hours)      Critically ill patients:  140 - 180 mg/dL    Results for Robert, [name] (MRN [id number]) as of 07/29/2011 12:03  Ref. Range 07/28/2011 08:25 07/10/2011 15:37 07/28/2011 19:40 07/10/2011 05:40  Glucose Latest Range: 70-99 mg/dL 782 (H)  956 (H) 213 (H)   Consider monitoring CBGs TIDHS and initiate sensitive correction scale (SSI) if needed.  Thank you  Piedad Climes [facility] Inpatient Diabetes Coordinator [phone]

## 2011-07-12 NOTE — Care Management Note (Signed)
    Page 1 of 1   August 10, 2011     11:15:16 AM   CARE MANAGEMENT NOTE 10-Aug-2011  Patient:  Robert [name],Robert [name]   Account Number:  [id number]  Date Initiated:  08/10/2011  Documentation initiated by:  Junius Creamer  Subjective/Objective Assessment:   adm w mi     Action/Plan:   lives alone, has o2 w [employer]-he's trying to get sm concenc that uses battery-power outlet in auto and electricity. will call anita at [employer] and let her know pt in hosp.   Anticipated DC Date:  07/25/2011   Anticipated DC Plan:  HOME W HOME HEALTH SERVICES      DC Planning Services  CM consult      Choice offered to / List presented to:             Status of service:   Medicare Important Message given?   (If response is "NO", the following Medicare IM given date fields will be blank) Date Medicare IM given:   Date Additional Medicare IM given:    Discharge Disposition:    Per UR Regulation:  Reviewed for med. necessity/level of care/duration of stay  If discussed at Long Length of Stay Meetings, dates discussed:    Comments:  6/3 11:07a debbie Kemaya Dorner rn,bsn [phone]

## 2011-07-12 NOTE — Progress Notes (Signed)
Patient Name: Robert [name]      SUBJECTIVE:admitted for ICD shocks--presumably VT He has ischemic CM w prior AMI with LAD stenting cx by shock and coma   Cared for at [facility] and received and ICD and amio--i discontinued this in DEC as his device was quiet and he has severe lung disease  Was being considered for heart lung transplant but appareently not to be -pursued at [facility] 2/2 mortality rates too high  Past Medical History  Diagnosis Date  . Obstructive emphysema   . Systolic heart failure, chronic     [facility] [place] since December 29th -has been home since April 1st.  [facility] Admissions were for pulmonary failure.  Marland Kitchen Heart failure, diastolic, chronic   . Coronary disease  s/p MI  >>LAD     Of native coronary artery  . Ventricular tachycardia   . ICD (implantable cardiac defibrillator) in place   . Shortness of breath   . Ischemic cardiomyopathy   . Pacemaker   . Asthma   . Pneumonia   . Chronic kidney disease   . GERD (gastroesophageal reflux disease)   . Headache   . Anxiety PTSD  . Depression     PHYSICAL EXAM Filed Vitals:   07/23/2011 0700 07/19/2011 0738 08/05/2011 0800 07/28/2011 0900  BP: 91/69  103/66 90/60  Pulse: 72  79 78  Temp:   97.6 F (36.4 C)   TempSrc:   Oral   Resp: 19  24 28   Height:      Weight:      SpO2: 97% 100% 92% 92%    Well developed and nourished in no acute distress HENT normal Neck supple with JVP-flat Clear Regular rate and rhythm, no murmurs or gallops Abd-soft with active BS No Clubbing cyanosis edema Skin-warm and dry A & Oriented  Grossly normal sensory and motor function   TELEMETRY: Reviewed telemetry pt in * nsr*:    Intake/Output Summary (Last 24 hours) at 08/05/2011 1014 Last data filed at 07/31/2011 0900  Gross per 24 hour  Intake 908.29 ml  Output    800 ml  Net 108.29 ml    LABS: Basic Metabolic Panel:  Lab 08/01/2011 0865 08/06/2011 1940 08/05/2011 0825  NA 139 139 140    K 4.4 3.8 3.3*  CL 101 99 100  CO2 24 26 24   GLUCOSE 178* 230* 251*  BUN 23 20 17   CREATININE 1.60* 1.63* 1.29  CALCIUM 8.8 8.9 --  MG -- -- 2.1  PHOS -- -- --   Cardiac Enzymes:  Basename 07/20/2011 1940 07/27/2011 1537 07/30/2011 0825  CKTOTAL 342* 357* 231  CKMB 34.9* 36.5* 23.1*  CKMBINDEX -- -- --  TROPONINI 7.18* 6.55* 1.70*   CBC:  Lab 07/13/2011 0825  WBC 14.9*  NEUTROABS --  HGB 11.2*  HCT 36.5*  MCV 86.7  PLT 231   PROTIME: No results found for this basename: LABPROT:3,INR:3 in the last 72 hours Liver Function Tests:  [facility] 07/13/2011 0825  AST 28  ALT 12  ALKPHOS 106  BILITOT 0.3  PROT 6.5  ALBUMIN 3.5    Basename 07/26/2011 0825  TSH 0.971  T4TOTAL --  T3FREE --  THYROIDAB --   Anemia Panel: No results found for this basename: VITAMINB12,FOLATE,FERRITIN,TIBC,IRON,RETICCTPCT in the last 72 hours   Device Interrogation: extnsive evaluation   The pt had noted taht there was an increase in HR concurrent with his worsening SOB 2-4 d prior to ICD discharge  Confirmed  HR 115 prior to VT-polymorphic and then sinus  ApVp followed suggesting that the atrial rhythm was also changed by the shock, and perhaps was an atrial tach With PM-VT question then was ischemia, perhaps demand or supply \     ASSESSMENT AND PLAN:  Patient Active Hospital Problem List: VENTRICULAR TACHYCARDIA (12/29/2009)       SVT (supraventricular tachycardia) (12/15/2010)                   NSTEMI (non-ST elevated myocardial infarction) (07/22/2011)   AUTOMATIC IMPLANTABLE CARDIAC DEFIBRILLATOR SITU (11/19/2009)    Cardiomyopahty-ischemic  Acute renal injury  The issues are why VT and why rtroponin and why SOB  A unifying hypothesis would be atrial tach>>CHF and demand ischemia and VT.  Another would be worsening COPD with 2/2 sinus tach and demand ischemia  The fact that the shock changed atrial rhythm from spont to paced at slower rate suggests an "arrhythmia"  I would 1)  proceed with cath-tomorrow>> with Cr elevated 2) contiinue amio 3) Pulm consult 4) is [facility] doing heart lung    Signed, Sherryl Manges MD  07/21/2011
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
## 2011-07-13 ENCOUNTER — Inpatient Hospital Stay (HOSPITAL_COMMUNITY): Payer: Medicare Other

## 2011-07-13 DIAGNOSIS — R079 Chest pain, unspecified: Secondary | ICD-10-CM

## 2011-07-13 DIAGNOSIS — I214 Non-ST elevation (NSTEMI) myocardial infarction: Secondary | ICD-10-CM

## 2011-07-13 LAB — DIFFERENTIAL
Basophils Relative: 0 % (ref 0–1)
Eosinophils Absolute: 0.1 10*3/uL (ref 0.0–0.7)
Lymphs Abs: 1.5 10*3/uL (ref 0.7–4.0)
Monocytes Relative: 10 % (ref 3–12)
Neutro Abs: 13.3 10*3/uL — ABNORMAL HIGH (ref 1.7–7.7)
Neutrophils Relative %: 80 % — ABNORMAL HIGH (ref 43–77)

## 2011-07-13 LAB — POCT I-STAT 3, ART BLOOD GAS (G3+)
O2 Saturation: 98 %
TCO2: 26 mmol/L (ref 0–100)
pCO2 arterial: 40.9 mmHg (ref 35.0–45.0)
pH, Arterial: 7.391 (ref 7.350–7.450)
pO2, Arterial: 105 mmHg — ABNORMAL HIGH (ref 80.0–100.0)

## 2011-07-13 LAB — SODIUM, URINE, RANDOM: Sodium, Ur: 40 mEq/L

## 2011-07-13 LAB — URINALYSIS, ROUTINE W REFLEX MICROSCOPIC
Hgb urine dipstick: NEGATIVE
Protein, ur: NEGATIVE mg/dL
Specific Gravity, Urine: 1.021 (ref 1.005–1.030)
Urobilinogen, UA: 0.2 mg/dL (ref 0.0–1.0)

## 2011-07-13 LAB — URINE MICROSCOPIC-ADD ON

## 2011-07-13 LAB — OSMOLALITY, URINE: Osmolality, Ur: 517 mOsm/kg (ref 390–1090)

## 2011-07-13 LAB — CBC
Hemoglobin: 11 g/dL — ABNORMAL LOW (ref 13.0–17.0)
MCHC: 30.6 g/dL (ref 30.0–36.0)
Platelets: 281 10*3/uL (ref 150–400)
RBC: 4.11 MIL/uL — ABNORMAL LOW (ref 4.22–5.81)

## 2011-07-13 LAB — CARDIAC PANEL(CRET KIN+CKTOT+MB+TROPI)
CK, MB: 3.7 ng/mL (ref 0.3–4.0)
Relative Index: INVALID (ref 0.0–2.5)
Troponin I: 2.43 ng/mL (ref <0.30)

## 2011-07-13 MED ORDER — ALPRAZOLAM 0.5 MG PO TABS
0.5000 mg | ORAL_TABLET | Freq: Three times a day (TID) | ORAL | Status: DC | PRN
  Administered 2011-07-13 – 2011-07-14 (×2): 0.5 mg via ORAL
  Filled 2011-07-13 (×2): qty 1

## 2011-07-13 MED ORDER — AMIODARONE HCL 200 MG PO TABS
400.0000 mg | ORAL_TABLET | Freq: Two times a day (BID) | ORAL | Status: DC
  Administered 2011-07-13 (×2): 400 mg via ORAL
  Filled 2011-07-13 (×4): qty 2

## 2011-07-13 MED ORDER — FUROSEMIDE 10 MG/ML IJ SOLN
40.0000 mg | Freq: Two times a day (BID) | INTRAMUSCULAR | Status: DC
  Administered 2011-07-13 (×2): 40 mg via INTRAVENOUS
  Filled 2011-07-13 (×4): qty 4

## 2011-07-13 NOTE — Progress Notes (Signed)
   Patient Name: Robert [name]      SUBJECTIVE:  More shortness of breath  CT  Fibrosis  And prob CHF  Past Medical History  Diagnosis Date  . Obstructive emphysema   . Systolic heart failure, chronic     [facility] [place] since December 29th -has been home since April 1st.  [facility] Admissions were for pulmonary failure.  Marland Kitchen Heart failure, diastolic, chronic   . Coronary disease  s/p MI  >>LAD     Of native coronary artery  . Ventricular tachycardia   . ICD (implantable cardiac defibrillator) in place   . Shortness of breath   . Ischemic cardiomyopathy   . Pacemaker   . Asthma   . Pneumonia   . Chronic kidney disease   . GERD (gastroesophageal reflux disease)   . Headache   . Anxiety PTSD  . Depression     PHYSICAL EXAM Filed Vitals:   07/13/11 0600 07/13/11 0746 07/13/11 0756 07/13/11 0758  BP: 107/62     Pulse: 84     Temp:      TempSrc:      Resp:      Height:      Weight:      SpO2: 99% 99% 94% 95%    Well developed and nourished in no acute distress HENT normal Neck supple with JVP-flat Crackles and decreased breaeth sounds Regular rate and rhythm, no murmurs or gallops Abd-soft with active BS No Clubbing cyanosis edema Skin-warm and dry A & Oriented  Grossly normal sensory and motor function   TELEMETRY: Reviewed telemetry pt in nsr    Intake/Output Summary (Last 24 hours) at 07/13/11 0842 Last data filed at 07/13/11 0600  Gross per 24 hour  Intake 1227.6 ml  Output    400 ml  Net  827.6 ml    LABS: Basic Metabolic Panel:  Lab 08-04-11 1610 07/31/2011 1940 07/22/2011 0825  NA 139 139 140  K 4.4 3.8 3.3*  CL 101 99 100  CO2 24 26 24   GLUCOSE 178* 230* 251*  BUN 23 20 17   CREATININE 1.60* 1.63* 1.29  CALCIUM 8.8 8.9 --  MG -- -- 2.1  PHOS -- -- --   Cardiac Enzymes:  Basename 07/21/2011 1940 07/10/2011 1537 07/31/2011 0825  CKTOTAL 342* 357* 231  CKMB 34.9* 36.5* 23.1*  CKMBINDEX -- -- --    TROPONINI 7.18* 6.55* 1.70*   CBC:  Lab 08/08/2011 0825  WBC 14.9*  NEUTROABS --  HGB 11.2*  HCT 36.5*  MCV 86.7  PLT 231   PROTIME:  Basename 07/13/11 0544  LABPROT 18.9*  INR 1.55*   Liver Function Tests:  Basename 07/30/2011 0825  AST 28  ALT 12  ALKPHOS 106  BILITOT 0.3  PROT 6.5  ALBUMIN 3.5   Thyroid Function Tests:  Basename 07/16/2011 0825  TSH 0.971  T4TOTAL --  T3FREE --  THYROIDAB --     ASSESSMENT AND PLAN:  Patient Active Hospital Problem List: VENTRICULAR TACHYCARDIA (12/29/2009)   COPD  SVT (supraventricular tachycardia) (12/15/2010)      AUTOMATIC IMPLANTABLE CARDIAC DEFIBRILLATOR SITU (11/19/2009)   CARDIOMYOPATHY, ISCHEMIC (11/19/2009)   Need to check renal funciton and increase diuretics  Hold off on cath Change amio IV>>PO thaks to pulm Await input    Signed, Sherryl Manges MD  07/13/2011

## 2011-07-13 NOTE — Progress Notes (Signed)
Informed Dr.Sood that pt. Is requiring NRB in order to be comfortable. Dr.McQuaid was in earlier to evaluate pt. In which at that point MD told pt. Concern in giving high concentrations of O2  Related to toxicity. Will continue to monitor closely.

## 2011-07-13 NOTE — Plan of Care (Signed)
Problem: Phase I Progression Outcomes Goal: OOB as tolerated unless otherwise ordered Outcome: Not Progressing Patient becomes very SOB with activity. One person assist to [facility].
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
## 2011-07-14 ENCOUNTER — Inpatient Hospital Stay (HOSPITAL_COMMUNITY): Payer: Medicare Other | Admitting: Anesthesiology

## 2011-07-14 ENCOUNTER — Inpatient Hospital Stay (HOSPITAL_COMMUNITY): Payer: Medicare Other

## 2011-07-14 ENCOUNTER — Encounter (HOSPITAL_COMMUNITY): Payer: Self-pay | Admitting: Anesthesiology

## 2011-07-14 DIAGNOSIS — I4901 Ventricular fibrillation: Secondary | ICD-10-CM

## 2011-07-14 DIAGNOSIS — I469 Cardiac arrest, cause unspecified: Secondary | ICD-10-CM

## 2011-07-14 DIAGNOSIS — J9620 Acute and chronic respiratory failure, unspecified whether with hypoxia or hypercapnia: Secondary | ICD-10-CM

## 2011-07-14 DIAGNOSIS — I5023 Acute on chronic systolic (congestive) heart failure: Secondary | ICD-10-CM

## 2011-07-14 LAB — BASIC METABOLIC PANEL
BUN: 23 mg/dL (ref 6–23)
CO2: 26 mEq/L (ref 19–32)
Chloride: 96 mEq/L (ref 96–112)
Creatinine, Ser: 1.67 mg/dL — ABNORMAL HIGH (ref 0.50–1.35)
Glucose, Bld: 120 mg/dL — ABNORMAL HIGH (ref 70–99)
Potassium: 4.5 mEq/L (ref 3.5–5.1)

## 2011-07-14 LAB — POCT I-STAT 3, ART BLOOD GAS (G3+)
Acid-base deficit: 1 mmol/L (ref 0.0–2.0)
Patient temperature: 98.6
pH, Arterial: 7.326 — ABNORMAL LOW (ref 7.350–7.450)

## 2011-07-14 LAB — URIC ACID: Uric Acid, Serum: 11.7 mg/dL — ABNORMAL HIGH (ref 4.0–7.8)

## 2011-07-14 MED ORDER — LIDOCAINE HCL (CARDIAC) 20 MG/ML IV SOLN
INTRAVENOUS | Status: AC
  Filled 2011-07-14: qty 5

## 2011-07-14 MED ORDER — AMIODARONE HCL IN DEXTROSE 360-4.14 MG/200ML-% IV SOLN
INTRAVENOUS | Status: AC
  Filled 2011-07-14: qty 200

## 2011-07-14 MED ORDER — ETOMIDATE 2 MG/ML IV SOLN
INTRAVENOUS | Status: AC
  Filled 2011-07-14: qty 20

## 2011-07-14 MED ORDER — ROCURONIUM BROMIDE 50 MG/5ML IV SOLN
INTRAVENOUS | Status: AC
  Filled 2011-07-14: qty 2

## 2011-07-14 MED ORDER — SUCCINYLCHOLINE CHLORIDE 20 MG/ML IJ SOLN
INTRAMUSCULAR | Status: AC
  Filled 2011-07-14: qty 10

## 2011-07-16 ENCOUNTER — Telehealth: Payer: Self-pay | Admitting: Internal Medicine

## 2011-07-16 LAB — POCT I-STAT, CHEM 8
BUN: 26 mg/dL — ABNORMAL HIGH (ref 6–23)
Calcium, Ion: 1.18 mmol/L (ref 1.12–1.32)
Creatinine, Ser: 1.7 mg/dL — ABNORMAL HIGH (ref 0.50–1.35)
Hemoglobin: 11.2 g/dL — ABNORMAL LOW (ref 13.0–17.0)
Sodium: 135 mEq/L (ref 135–145)
TCO2: 26 mmol/L (ref 0–100)

## 2011-07-16 NOTE — Telephone Encounter (Signed)
Received Faxed copy of Death Certificate,taken to [facility] M/Dr.Klein for Signature  07/16/11/KM

## 2011-07-16 NOTE — Telephone Encounter (Signed)
Dr.Klein Signed Faxed Copy Of D/C, I faxed back to [facility] @ [phone]  07/16/11/KM

## 2011-07-22 NOTE — Telephone Encounter (Signed)
Original D/C Received From [facility] Took to Geroge Baseman for Robert [name] To sign 07/22/11/KM

## 2011-07-22 NOTE — Telephone Encounter (Signed)
Dr.Klein Signed Original D/C for pt, Mailed to [facility] In Envelope Provided  07/22/11/KM

## 2011-07-23 ENCOUNTER — Institutional Professional Consult (permissible substitution): Payer: Medicare Other | Admitting: Pulmonary Disease

## 2011-07-26 ENCOUNTER — Ambulatory Visit: Payer: Medicare Other | Admitting: Internal Medicine

## 2011-08-09 NOTE — Progress Notes (Addendum)
CODE BLUE NOTE  Patient Name: Robert [name]   MRN: [id number]   Date of Birth/ Sex: [birth year] , male      Admission Date: 07/10/2011  Attending Provider: June Leap, MD  Primary Diagnosis: <principal problem not specified>    Indication: Pt was in his usual state of health until this AM, when he was noted to be V tach/V fib but was still awake and responsive.  Cardiology was at bedside (primary team) and noted that his ICD had fired.  The patient was able to convert to sinus several times after ICD shocks.  He was given a 300 mg loading dose of Amiodarone and started on an amiodarone drip.  After several more ICD shocks and conversions he was also given 100 mg of Lidocaine and 2g of Magnesium to attempt to hold him in Sinus rhythm. At 6:15 he went into Vtach/Vfib and then lost consciousness.  CPR was started and he was intubated by Anesthesia.  During CPR he received 4 doses of Epinephrine, an additional 150 mg of amiodarone, 100 mg of lidocaine, and 1 dose of atropine.  He received a total of 6 defibrillations from the [facility] and an additional 2 shocks from his ICD CPR was continued for 20 minutes with a persistent V tach/V fib arrest when the code was called and time of death was called.  Time of death was 6:35 am.   Technical Description:  - CPR performance duration:  20 minutes  - Was defibrillation or cardioversion used? Yes   - Was external pacer placed? No  - Was patient intubated pre/post CPR? Yes    Medications Administered: Y = Yes; Blank = No Amiodarone  y  Atropine  y  Calcium    Epinephrine  y  Lidocaine  y  Magnesium  y  Norepinephrine    Phenylephrine    Sodium bicarbonate    Vasopressin     Post CPR evaluation:  - Final Status - Was patient successfully resuscitated ? No   Miscellaneous Information:  - Time of death:  6:35 AM  - Primary team notified?  Yes Primary team was at bedside throughout code.  - Family Notified? Yes, Closest family member is about 2 hours  away.  They were notified by phone.   Leodis Sias, MD   07/23/2011, 6:41 AM  Agree with above. I was present for and directed entire resuscitation effort. He had several episodes of VT/VF overnight shocked by ICD. This am developed altered mental status and respiratory depression. Given lasix. ABG ok. Then developed refractory VT/VF. Shocked ~20 times from his ICD and externally. Intubated. Foley placed.Treated with CPR and multiple boluses of EPI, amio, mag and lidocaine. Despite extensive efforts from code team unable to resuscitate. Pronounced at 6:35a. Family notified.   Truman Hayward 6:53 AM

## 2011-08-09 NOTE — Progress Notes (Signed)
Nursing: At 0549 this nurse was at bedside with patient completing a bath. Patient became unresponsive and noted to be in VFib. Patient's internal ICD fired. Patient became responsive and was made aware of shock. At 0554 Vfib rhythm returned and was shocked by ICD. Dr. Gala Romney at bedside and Code was called. Code Team arrived. Refer to code sheet. Code called at [phone]. Patient's daughter was called and made aware of patient's death. Daughters en route to hospital and will be given patient's personal belongings.

## 2011-08-09 DEATH — deceased

## 2013-03-02 IMAGING — CR DG CHEST 1V PORT
1 series · 1 of 1 positions shown · non-contrast
Comparison: none

REASON FOR EXAM: hypoxia
COMMENTS:

[portable]
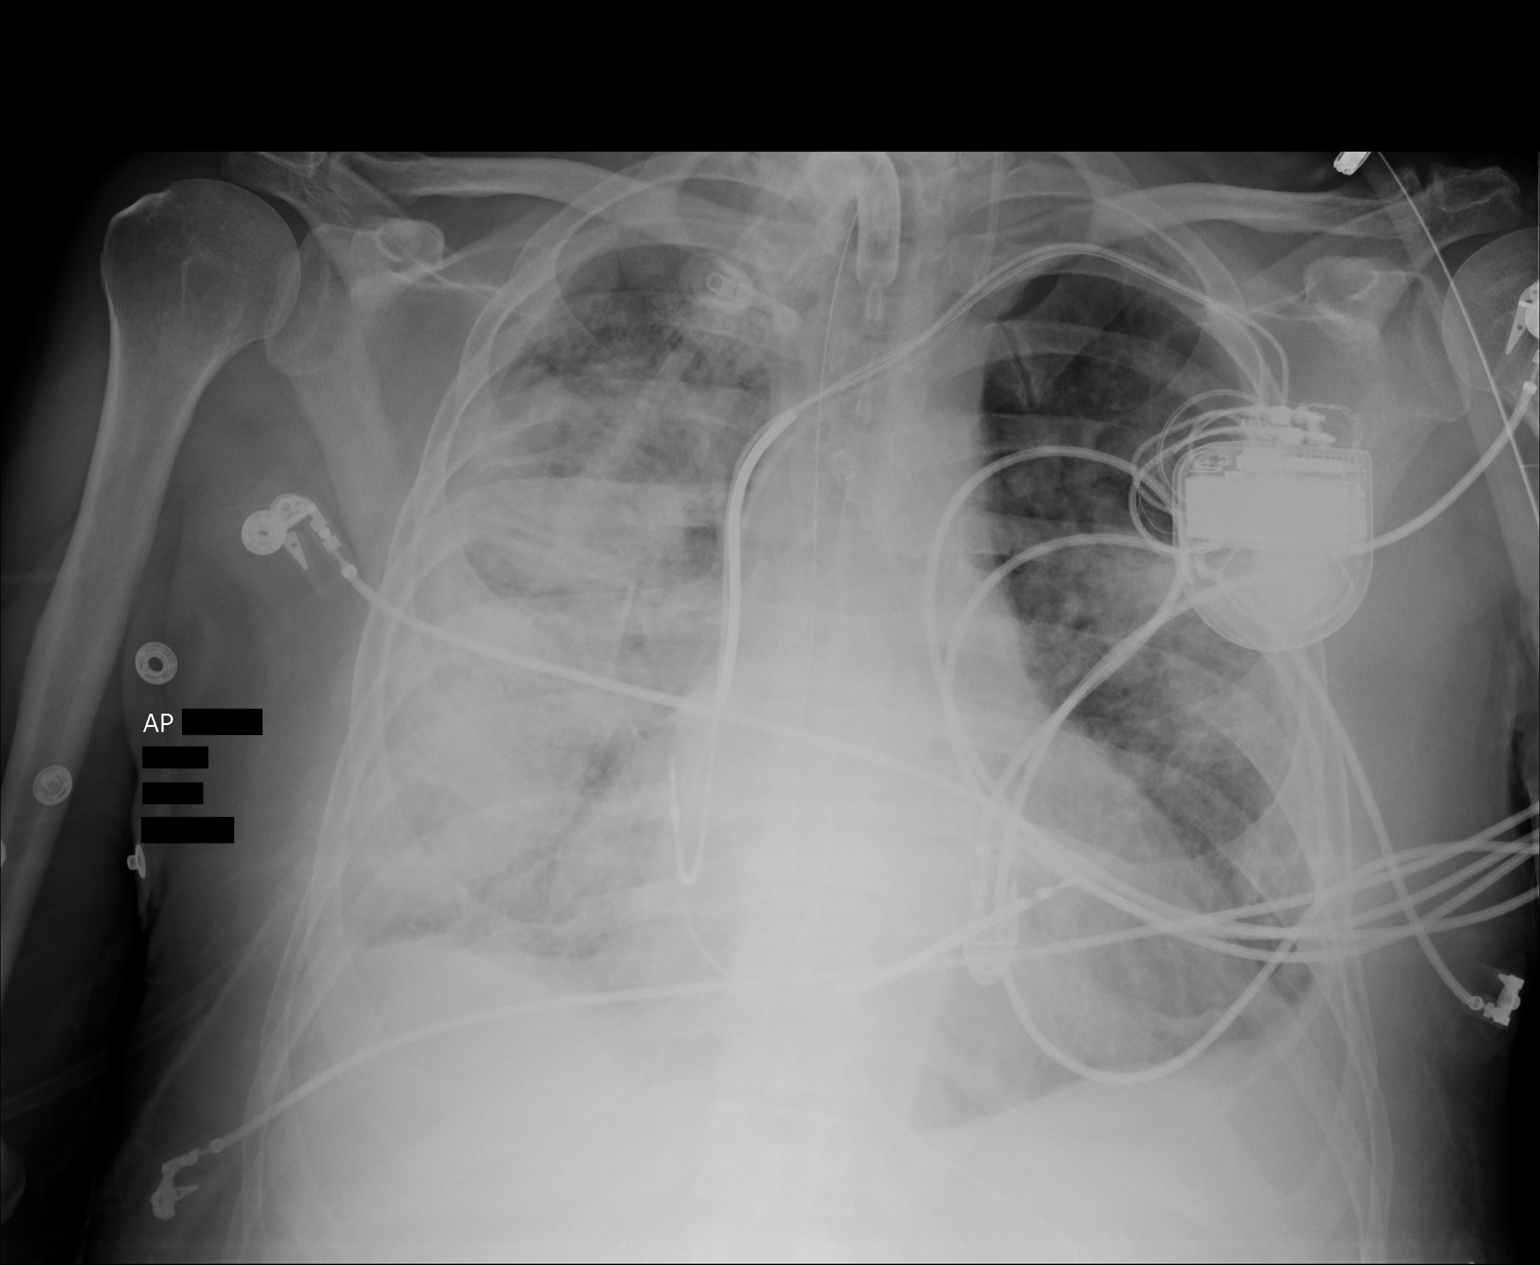

[1 of 1 positions shown; findings below may reference images not displayed]

PROCEDURE:     DXR - DXR PORTABLE CHEST SINGLE VIEW  - February 14, 2011  [DATE]

RESULT:     Comparison is made to the study 11 February, 2011.

Tracheostomy tube is present. Left internal jugular central venous catheters
present. The tip is in the superior vena cava. Nasogastric tube passes below
the diaphragm. Pacemaker device is present over the left chest. There is
increasing density within the right hemithorax consistent with progressive
pneumonia or edema. Progressive less prominent opacities in the left
midlung. Trace pleural effusions are present. The heart appears to be
borderline enlarged.
IMPRESSION: Aggressive increased parenchymal densities most suspicious
for bilateral progressive pneumonia or progressive edema. Evolving ARDS is
not excluded.

## 2013-03-06 IMAGING — CR DG ABDOMEN 2V
1 series · 2 of 2 positions shown · non-contrast
Comparison: none

REASON FOR EXAM: check for free air
COMMENTS:   Bedside (portable):Y

[Series 1: erect ap · 0.17mm/px · 2 of 2 slices shown]
[im 1/2]
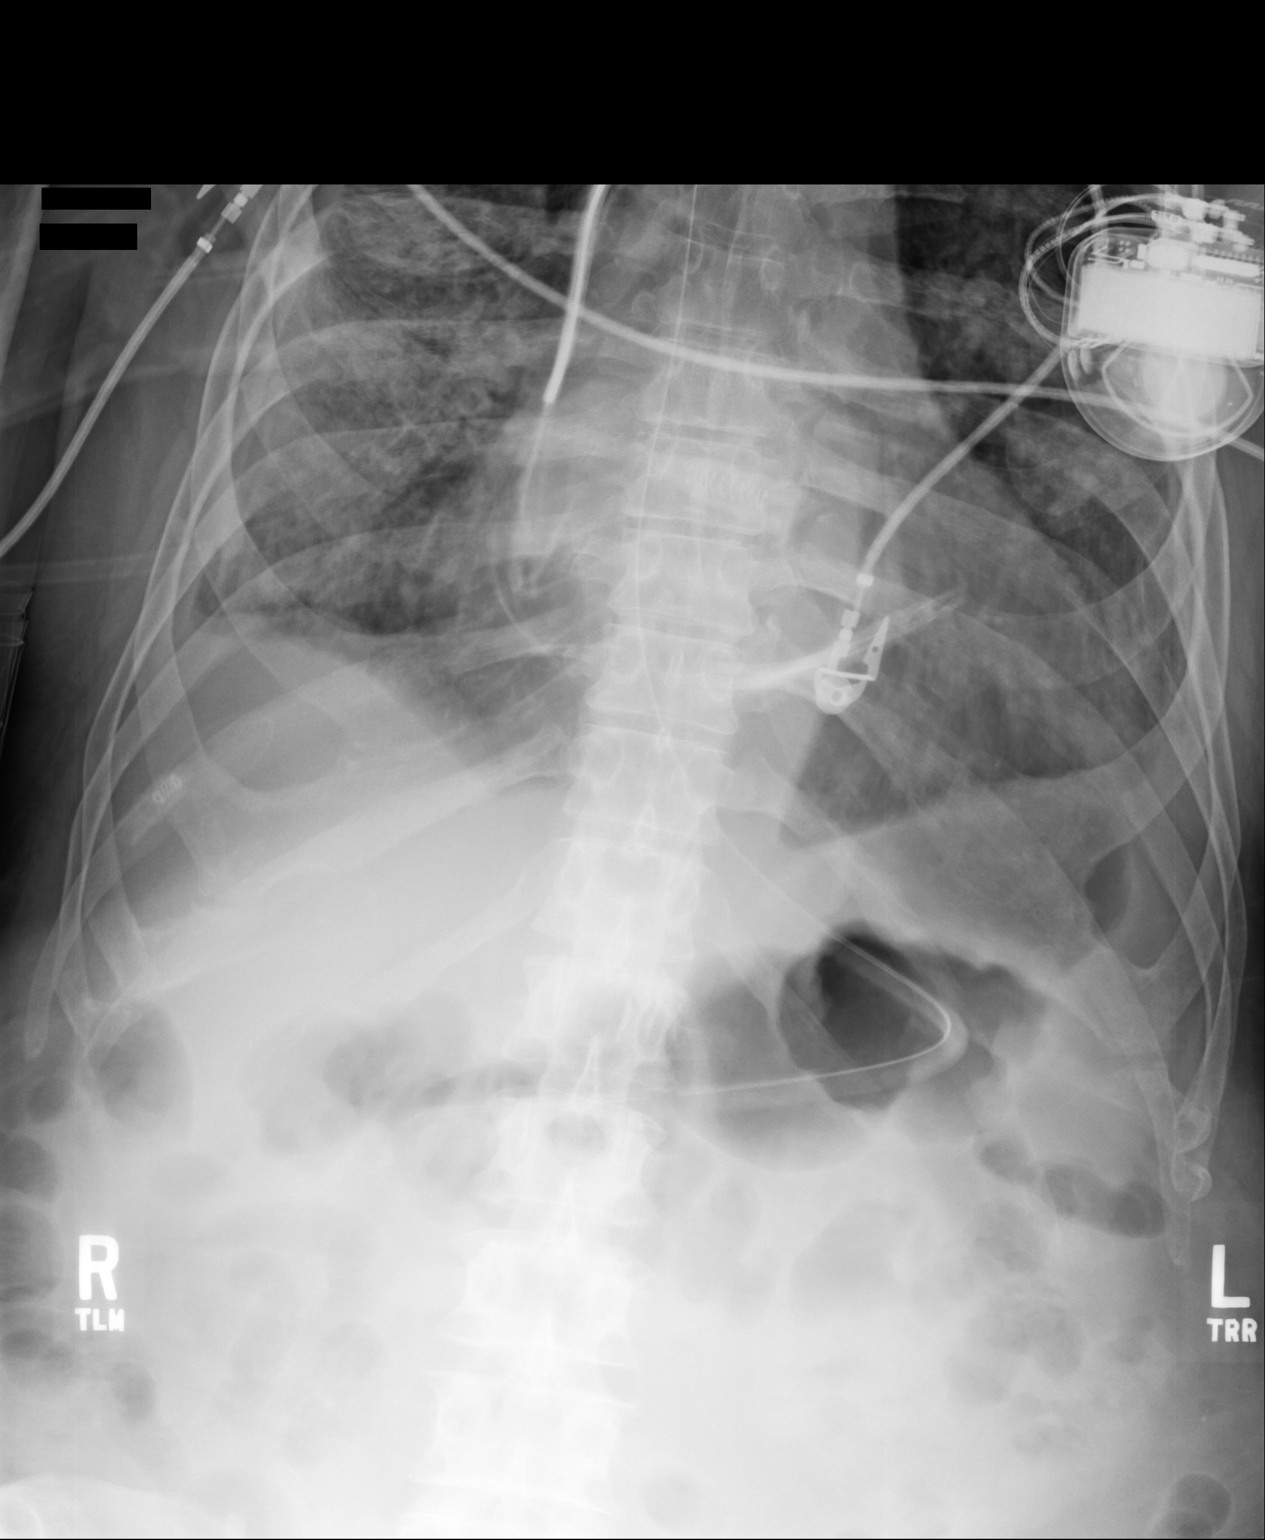
[im 2/2]
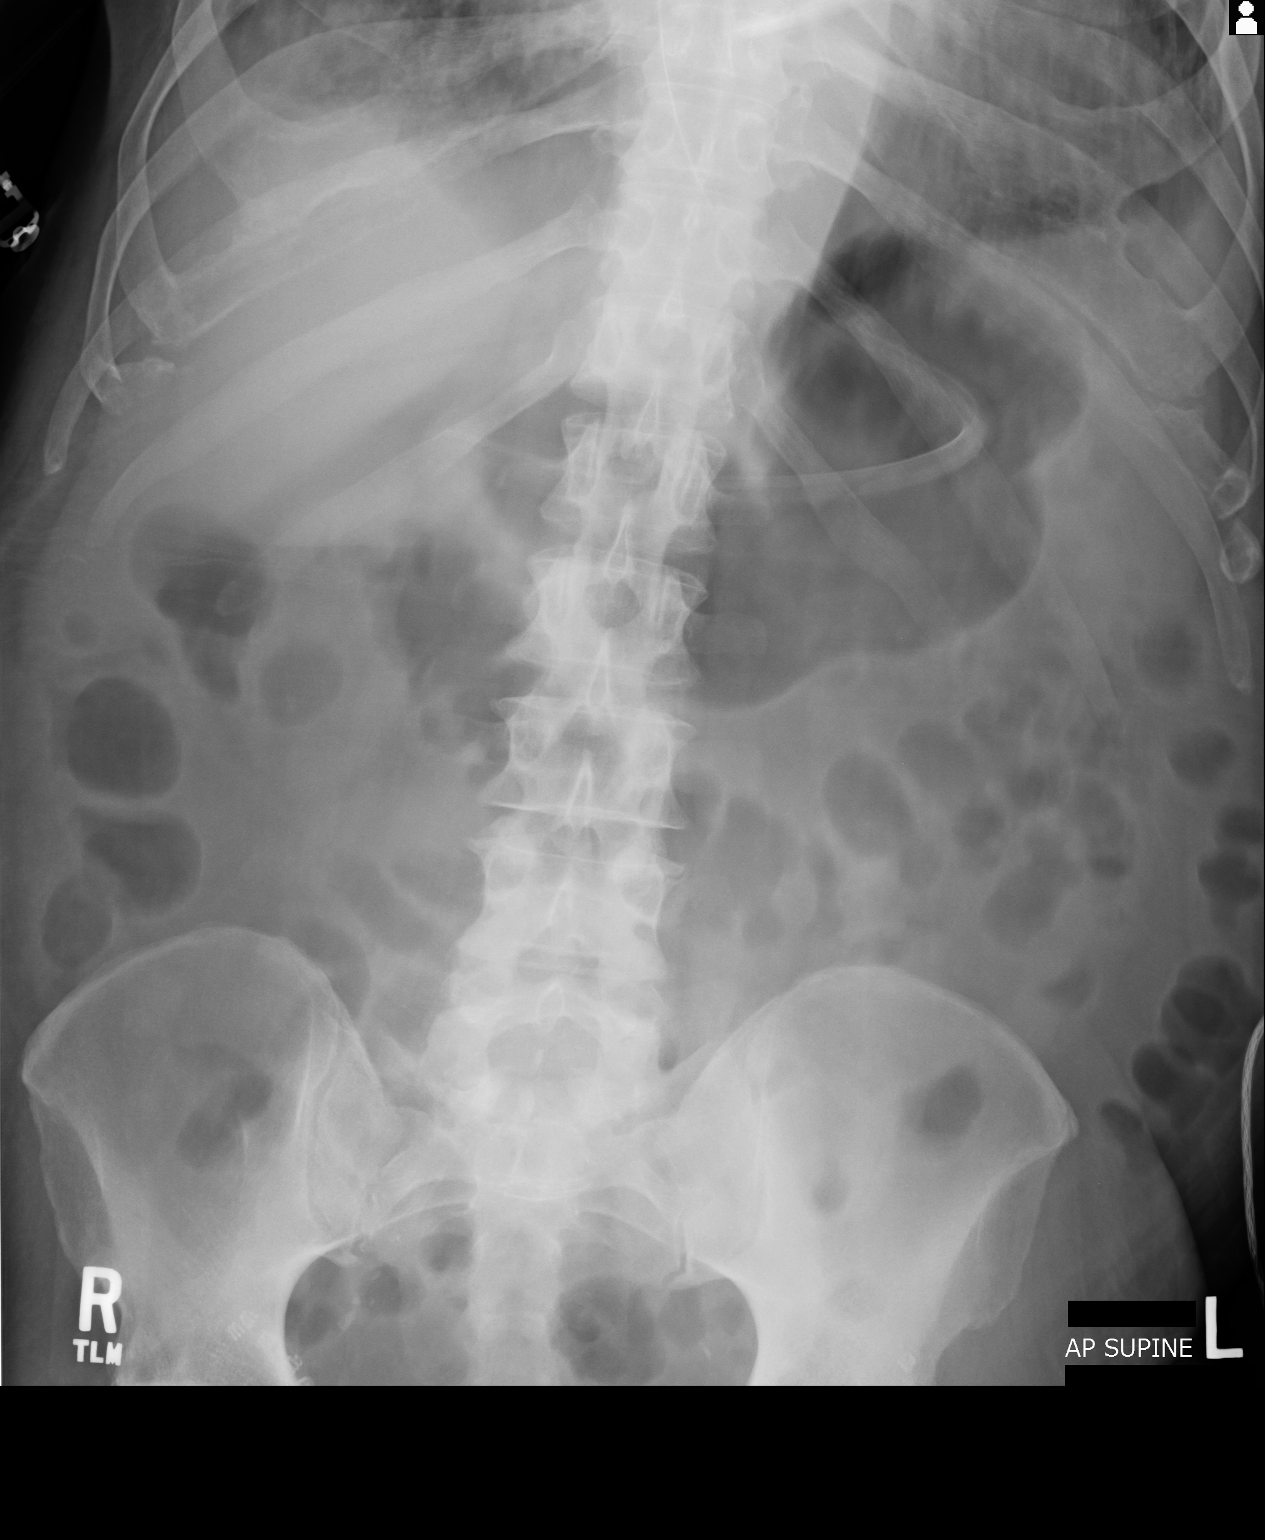

[2 of 2 positions shown; findings below may reference images not displayed]

PROCEDURE:     DXR - DXR ABDOMEN 2 V FLAT AND ERECT  - February 18, 2011  [DATE]

RESULT:     No subdiaphragmatic free air is seen. The bowel gas pattern
shows no specific abnormalities. No findings suspicious for bowel
obstruction are identified. A nasogastric tube is present with the distal
portion in the stomach. No abnormal intraabdominal calcifications are seen.
IMPRESSION: 1. No subdiaphragmatic free air is seen and no evidence for bowel
obstruction is identified.
2. No acute changes of the abdomen are observed.

## 2013-03-06 IMAGING — CR DG CHEST 1V PORT
1 series · 1 of 1 positions shown · non-contrast
Comparison: none

REASON FOR EXAM: check for air/ obstruction ( Abdominal) 2 view abdomen
COMMENTS:

[ap]
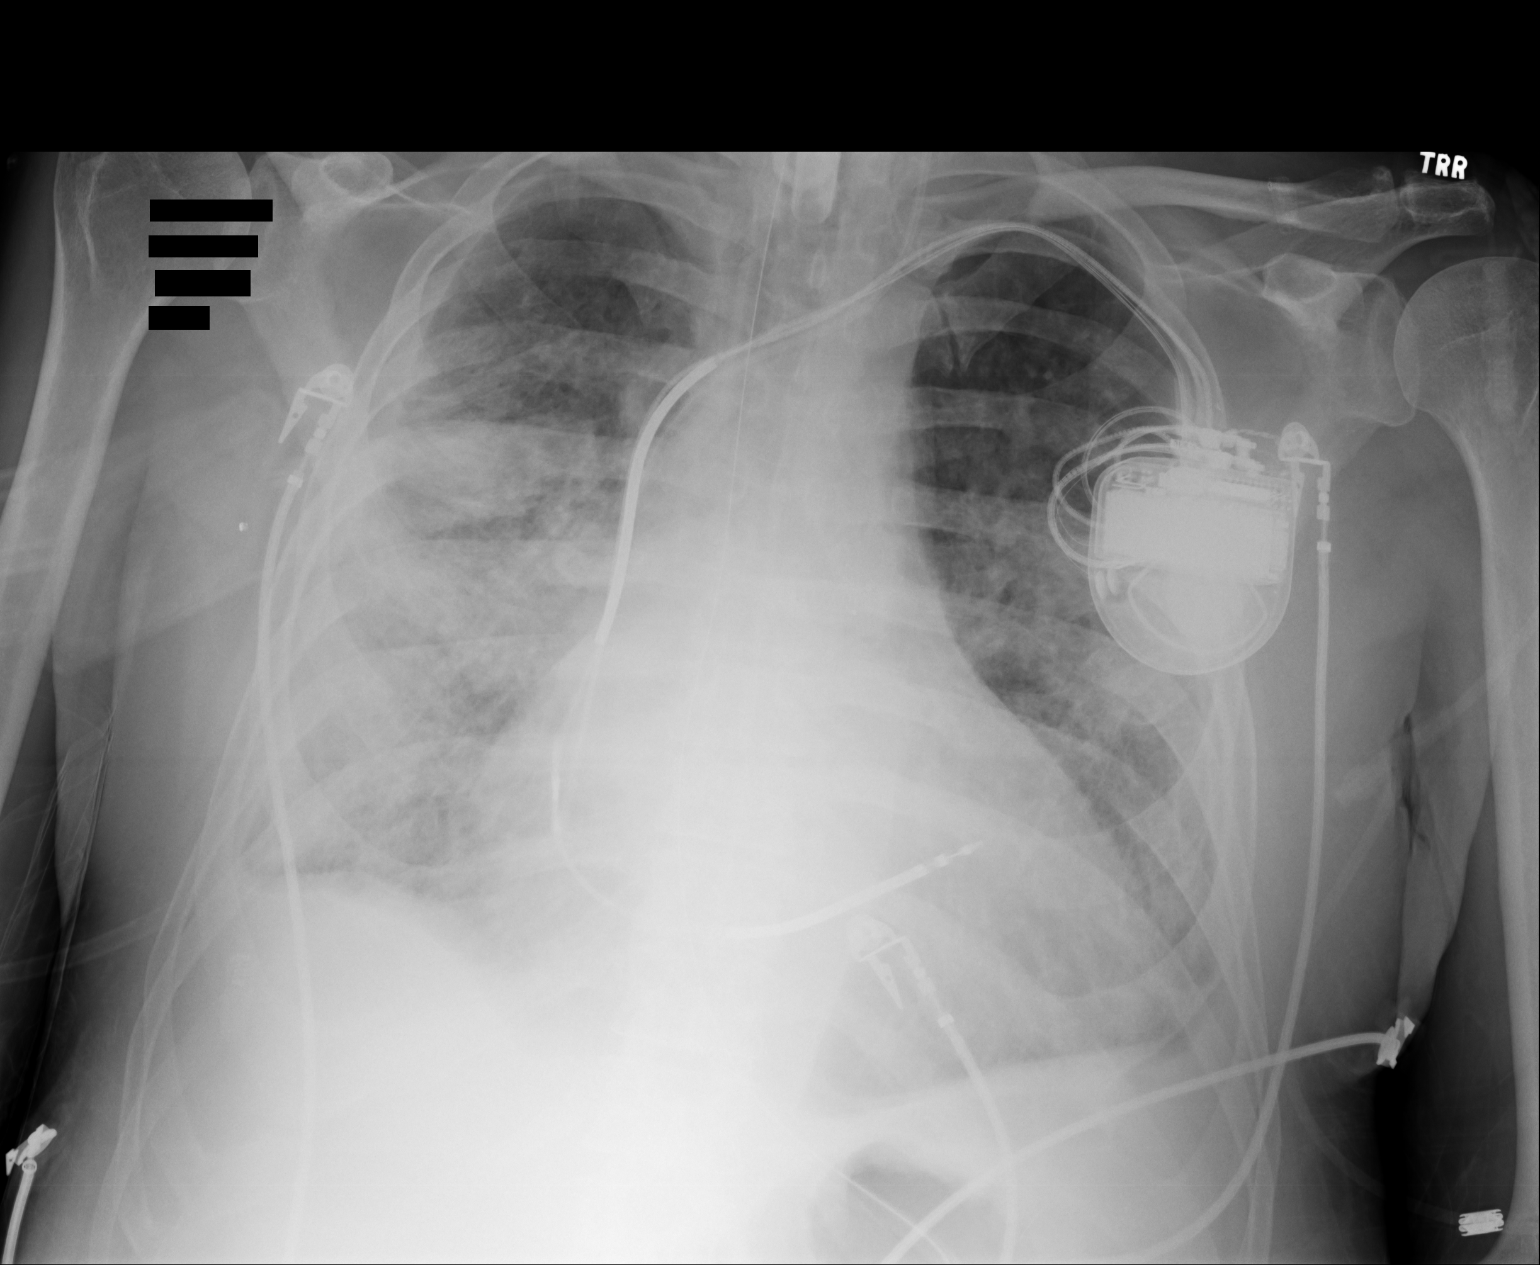

[1 of 1 positions shown; findings below may reference images not displayed]

PROCEDURE:     DXR - DXR PORTABLE CHEST SINGLE VIEW  - February 18, 2011  [DATE]

RESULT:     Comparison is made to the prior exam of 02/25/2011. There is
again noted increased density in the lower lung fields bilaterally. Little
or no appreciable interval change is seen as compared to the exam of
yesterday. Pneumonia would be the first consideration although atypical
edema could produce similar findings. The heart is enlarged but is stable in
size as compared to the prior exam. A cardiac pacemaker remains present. A
tracheostomy tube is observed. A nasogastric tube is present with the distal
portion in the stomach.
IMPRESSION: 1. Stable appearing chest as compared to the exam of yesterday.
2. There persists increased density in the lower lung fields bilaterally
consistent with pneumonia. Edema is also a consideration but is considered
less likely.
3. A tracheostomy remains present.
4. There is no shift of the midline structures to suggest unilateral airway
obstruction.

## 2013-07-27 IMAGING — CR DG CHEST 1V PORT
1 series · 1 of 1 positions shown · non-contrast
Comparison: none

REASON FOR EXAM: Chest Pain
COMMENTS:

[ap]
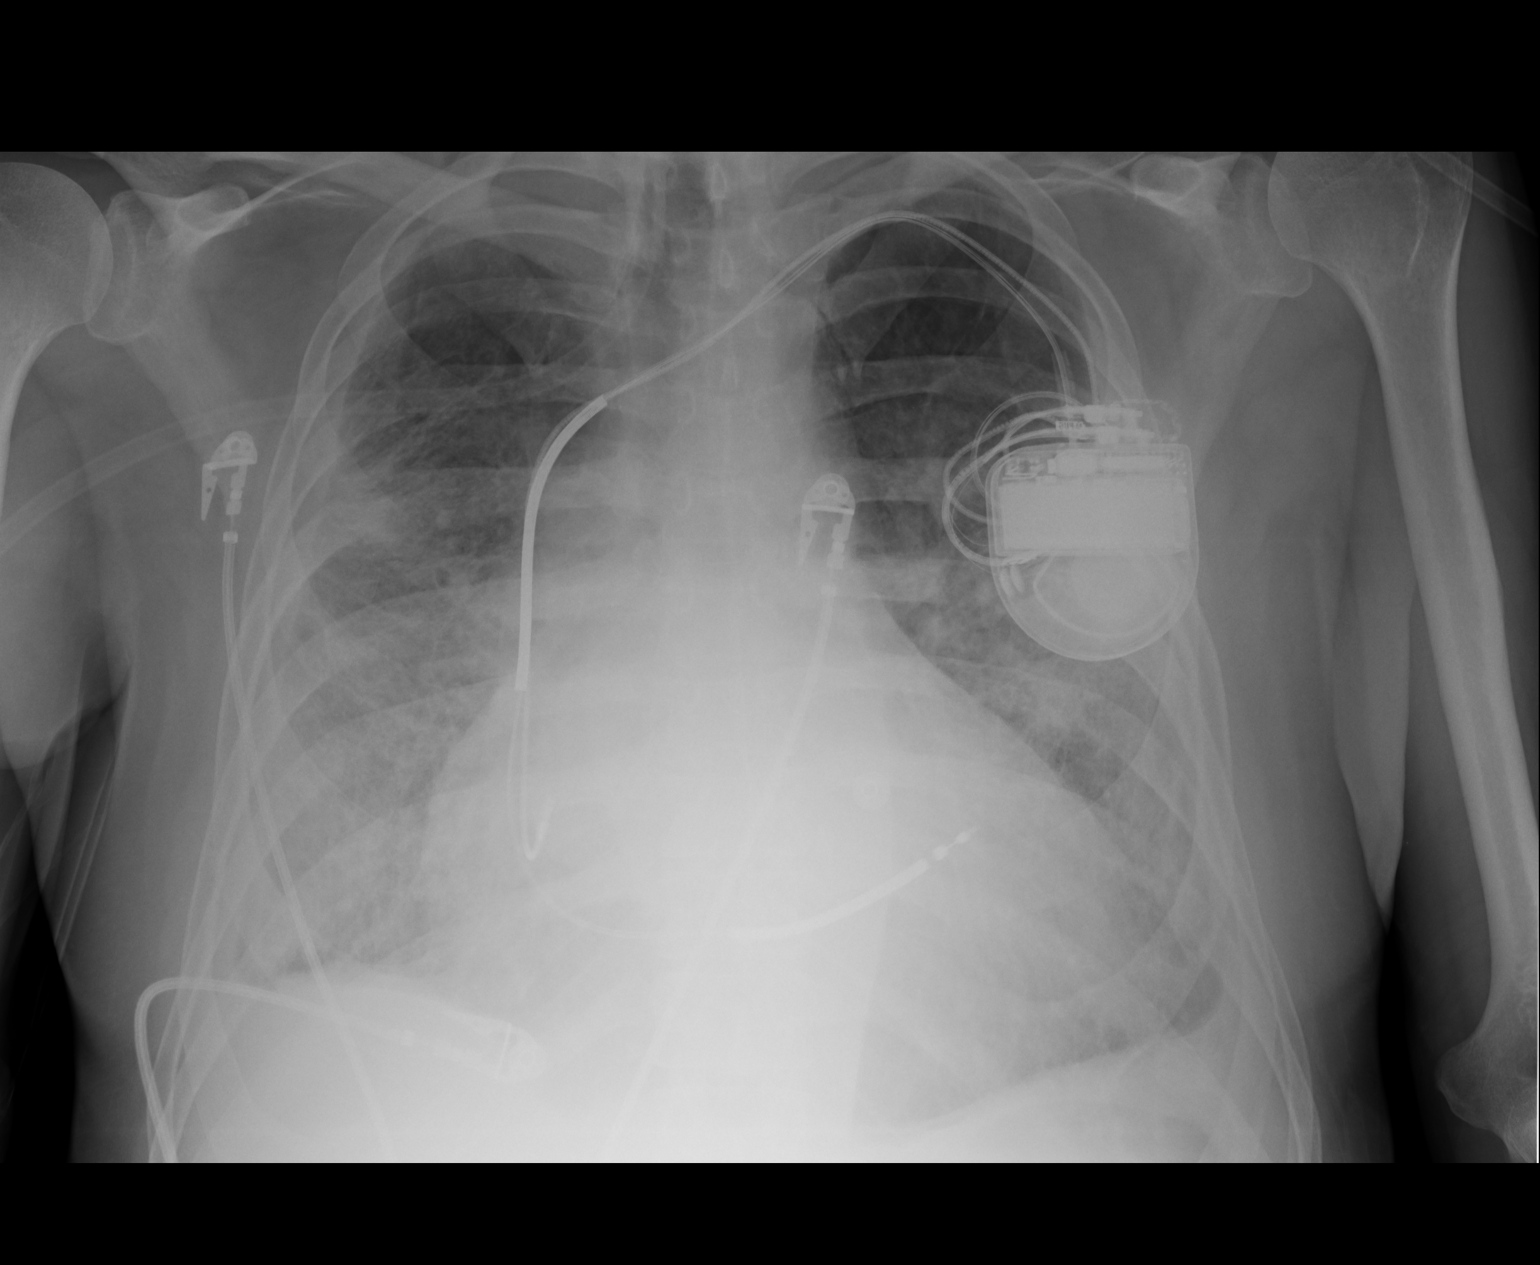

[1 of 1 positions shown; findings below may reference images not displayed]

PROCEDURE:     DXR - DXR PORTABLE CHEST SINGLE VIEW  - July 11, 2011  [DATE]

RESULT:     Frontal view of the chest is performed. Comparison is made to a
prior study dated 02/24/2011.

There is prominence of the interstitial markings and peribronchial cuffing.
Diffuse bilateral pulmonary opacities are identified. No focal regions of
consolidation are identified. There is blunting of the left costophrenic
angle. The cardiac silhouette is enlarged indicative of cardiomegaly. The
patient's tracheotomy, NG tube, and left central venous catheter has been
removed in the interim. The visualized bony skeleton is unremarkable.
IMPRESSION: 1.  Interstitial infiltrate likely representing a component of pulmonary
edema. A nonedematous infectious interstitial infiltrate is also of
diagnostic consideration, if clinically appropriate.
2.  Cardiomegaly.
3.  Surveillance evaluation is recommended.

## 2013-07-27 IMAGING — CR DG CHEST 1V PORT
1 series · 1 of 1 positions shown · non-contrast
Comparison: None

CLINICAL DATA: Short of breath

PORTABLE CHEST - 1 VIEW

[AP]
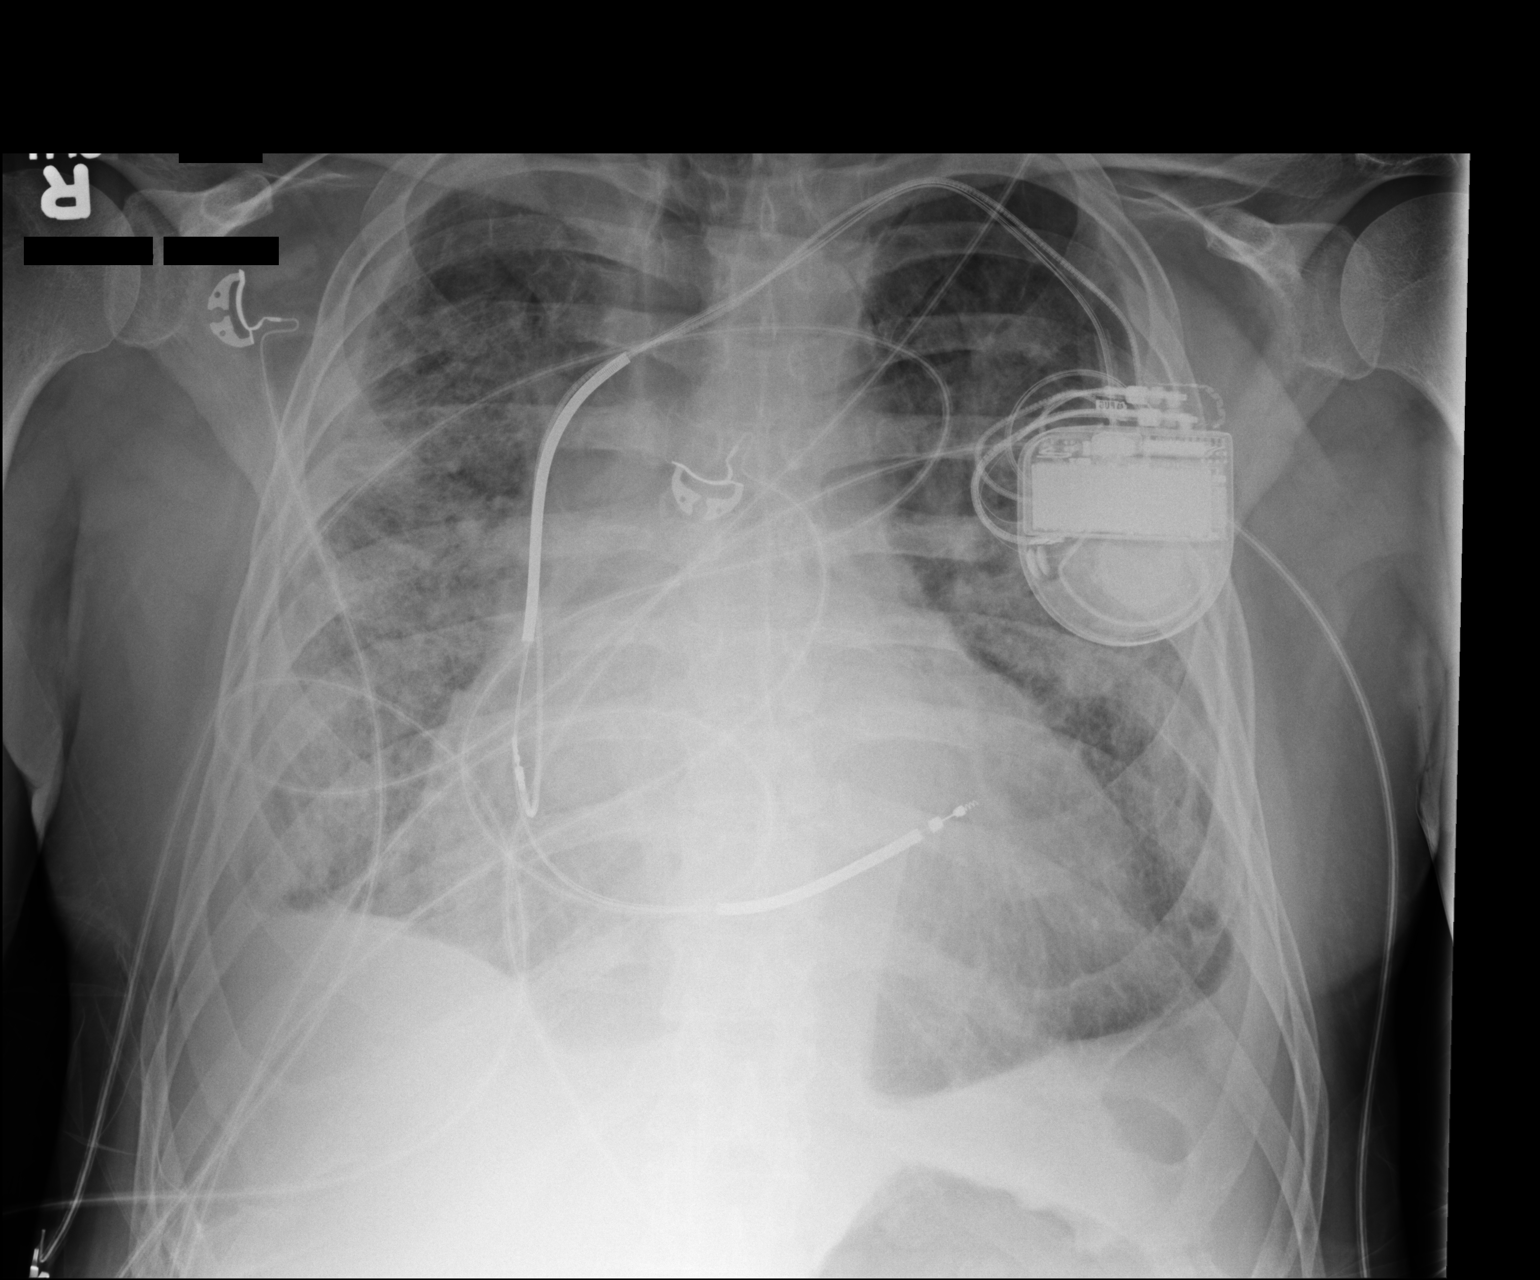

[1 of 1 positions shown; findings below may reference images not displayed]

FINDINGS: Left chest wall AICD is noted with lead in the right
atrial appendage and right ventricle.

The heart size appears normal.

Bilateral pleural effusions and interstitial edema identified
consistent with moderate CHF.
IMPRESSION: 1.  Moderate congestive heart failure .

## 2014-06-02 NOTE — Consult Note (Signed)
Details:    - Psychiatry: Follow up on yesterday's consult. Patieent is asaleep. Reviewed todays notes and MAR. At least at this moment he is calm. I will not wake him as there is little to gain since he can't talk. I think manageing his anxiety and agitation will be an ongoing process esp given his prior history of anxiety disorder and the unknown amout of cognitive change he may have suffered. I will not change any medications from yesterdays changes. I see he is scheduled for transfer to a specialty hospital for chronic vent care today. If he is still here on Monday I'll check back. If more psych help needded over the weekend you may call me at [phone] or page at [phone] or call the psychiatrist on call for face to fface assistance.   Electronic Signatures: Audery Amellapacs, Carrieanne Kleen T (MD)  (Signed 18-Jan-13 13:24)  Authored: Details   Last Updated: 18-Jan-13 13:24 by Audery Amellapacs, Jerri Hargadon T (MD)

## 2014-06-02 NOTE — Consult Note (Signed)
PATIENT NAME:  Robert [name], Robert [name] MR#:  [id number] DATE OF BIRTH:  1950/06/21  DATE OF CONSULTATION:  02/13/2011  REFERRING PHYSICIAN:  Alford Highlandichard Wieting, MD CONSULTING PHYSICIAN:  Lurline DelShaukat Gladstone Rosas, MD  REASON FOR CONSULTATION: Need for feeding tube placement.   HISTORY OF PRESENT ILLNESS: Ms Robert [name] is a 64 year old white male with history of tobacco abuse, chronic obstructive pulmonary disease, ischemic cardiomyopathy, MI with cardiogenic shock in September of 2012. The patient underwent a cardiac catheterization in August of [id number] which did not show significant stenosis, and medical management was recommended. The patient has a history of the stents placed prior to that. The patient was brought into the hospital on the February 06, 2011, with respiratory failure and was intubated emergently. The patient has been on a ventilator since then and it is anticipated that the patient will require long-term ventilatory support. ENT and gastroenterology consultations were obtained by the primary service for placement of tracheostomy as well as PEG tube for long-term management. Apparently Dr. Andee PolesVaught already discussed the case with the patient's family and they have agreed for tracheostomy tube.   PAST MEDICAL HISTORY: Chronic obstructive pulmonary disease, oxygen dependent. History of coronary artery disease. History of non-ST segment elevation myocardial infarction in August of 2012. Cardiac catheterization at that time showed 75% stenosis of the LAD, and medical management was recommended. History of ventricular fibrillation arrest, status post automatic implantable cardiac defibrillator placement.  History of alcohol abuse, and renal insufficiency.   PAST SURGICAL HISTORY: History of automatic implantable cardiac defibrillator and pacemaker placement, history of inguinal hernia repair.   MEDICINES: Medicines include aspirin  81 mg a day, Spiriva 18 mcg inhaler, Advair 250/50, oxygen 2L nasal cannula, Coreg  3.125 mg b.i.d., lisinopril 2.5 mg b.i.d., Lasix 20 mg b.i.d., fish oil, spironolactone 25 mg b.i.d., Xanax 0.25 mg b.i.d., Prilosec 20 mg b.i.d., and Lipitor 40 mg at bedtime.   FAMILY HISTORY: Premature coronary artery disease.   SOCIAL HISTORY: Ongoing tobacco abuse.    REVIEW OF SYSTEMS: Not available as the patient is intubated and sedated.   PHYSICAL EXAMINATION:  GENERAL: Fairly well-built male who does not appear to be acute distress. He is intubated and sedated.   VITAL SIGNS: The temperature is 99.9, pulse 74, respirations 16, blood pressure 104/65.   HEENT: Unremarkable.   NECK: Neck veins are somewhat engorged.   LUNGS: Grossly clear to auscultation bilaterally with fair air entry.   CARDIOVASCULAR: A 2/6 diastolic murmur, regular rate and rhythm.   ABDOMEN: Quite soft and benign. Bowel sounds are positive and normal.  No rebound or guarding was noted. No hepatosplenomegaly or ascites.   NEUROLOGIC: Examination is hard to assess because of sedation.   LABORATORIES:  White cell count is 16,000, hemoglobin is 11.6, hematocrit 35.4, and platelet count 149, 000. Electrolytes are fairly unremarkable. White cell count was 10,000 yesterday. PT/NR is not available. Chest x-ray done the day before yesterday showed bilateral patchy interstitial and alveolar airspace opacities which may represent pneumonitis versus pulmonary edema, according to the radiologist.   ASSESSMENT AND PLAN: Patient with respiratory failure, history of coronary artery disease, congestive heart failure, cardiomyopathy. Patient is on ventilator, and it is anticipated that the patient will require prolonged ventilatory support. PEG tube and tracheostomy have been recommended by the primary service. Dr. Andee PolesVaught is planning to put a tracheostomy tube within the next couple of days. I will discuss the options with the patient's family as the patient will likely require a feeding tube based on his  anticipated course in  the future. If the patient agrees we will plan on performing a feeding tube few days down the road due to him being on Plavix. I have discussed this with Dr. Renae Gloss, and we have decided to stop his Plavix today. It would be at least 3 days before we can perform a feeding tube safely and maybe even longer. I will discuss it with the family and make further recommendations. The patient is on baby aspirin which can be continued at this time as it does not carry significant risk of bleeding with minor surgical procedures.   Thank you so much Dr. Renae Gloss for involving me in the care of Robert [name]. We will follow.     ____________________________ Lurline Del, MD si:vtd D: 02/13/2011 14:04:07 ET T: 02/13/2011 14:27:50 ET JOB#: [id number]  cc: Lurline Del, MD, <Dictator> Lurline Del MD ELECTRONICALLY SIGNED 02/17/2011 10:48
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
## 2014-06-02 NOTE — Consult Note (Signed)
Chief Complaint:   Subjective/Chief Complaint Patient remains on ventilator although seems to be doing better and will probably be on Trach. collar soon. I would recommend a swallowing evaluation once on Trach collar. Please call be if PEG is needed. Will sign off untill then. Thanks.   Electronic Signatures: Lurline DelIftikhar, Io Dieujuste (MD)  (Signed 16-Jan-13 10:35)  Authored: Chief Complaint   Last Updated: 16-Jan-13 10:35 by Lurline DelIftikhar, Kaetlyn Noa (MD)

## 2014-06-02 NOTE — Consult Note (Signed)
Chief Complaint:   Subjective/Chief Complaint Case discussed with Dr. Belia HemanKasa who would like us to hold off of PEG due to significant improvement in patient's condition. Will follow him periodically.   Electronic Signatures: Lurline DelIftikhar, Leeland Lovelady (MD)  (Signed 09-Jan-13 18:09)  Authored: Chief Complaint   Last Updated: 09-Jan-13 18:09 by Lurline DelIftikhar, Elie Gragert (MD)

## 2014-06-02 NOTE — Consult Note (Signed)
Impression: 64yo WM w/ h/o CHF, COPD admitted with acute respiratory failure.  His CXR showed CHF and he has responded to diuresis.  He remains on the ventilator, but does not have significant wheezing.  He is requiring relative low amounts of oxygen.  He has not had any temps over 101 and no sputum or blood cultures were obtained on admission.  There does not appear to be any evidence for infection other than his elevated WBC on admission.  Review of his prior WBC show his WBC has never been normal over the past 6 months. Woud hold on any ill stop his antibiotics. Continue vent support as needed. Would wean steroids If off antibiotics, he spikes over 101.5, then would obtain sputum cultures, blood cultures, u/a and repeat CXR.    Electronic Signatures: Aubree Doody, Rosalyn GessMichael E (MD) (Signed on 02-Jan-13 12:49)  Authored   Last Updated: 02-Jan-13 12:52 by Zacari Stiff, Rosalyn GessMichael E (MD)

## 2014-06-02 NOTE — Op Note (Signed)
PATIENT NAME:  Robert DownsPPENS, Giorgi C MR#:  [id number] DATE OF BIRTH:  1950-09-12  DATE OF PROCEDURE:  02/14/2011  PREOPERATIVE DIAGNOSES:  1. Respiratory failure. 2. History of chronic obstructive pulmonary disease. 3. Coronary artery disease.   POSTOPERATIVE DIAGNOSES:  1. Respiratory failure. 2. History of chronic obstructive pulmonary disease. 3. Coronary artery disease.   PROCEDURE PERFORMED: Revision tracheostomy tube placement.   SURGEON: Kyung Ruddreighton C. Bobbie Valletta, MD  ANESTHESIA: General endotracheal anesthesia.   ESTIMATED BLOOD LOSS: Less than 10 mL.   IV FLUIDS: Please see anesthesia record.   COMPLICATIONS: None.   DRAINS/STENT PLACEMENTS: Size 6 cuffed tracheostomy tube.   INDICATIONS FOR PROCEDURE: Patient is a 64 year old male with history of chronic obstructive pulmonary disease and respiratory failure with failure to wean from vent. [facility] a [facility]   OPERATIVE FINDINGS: Significant scarring of the anterior trachea from previous tracheostomy tube placement. Tracheostomy placed inferior aspect of previous tracheostomy. Size 6 cuffed Shiley was placed secondary to significant scarring.   DESCRIPTION OF PROCEDURE: After the patient was identified in the Intensive Care Unit, the benefits and risks of the procedure were discussed and consent was reviewed. Patient was taken to the Operating Room, placed in supine position. General endotracheal anesthesia was continued. Due to the presence of the patient's pacemaker, monopolar cautery was not used and bipolar and Harmonic were the only devices used at this time. The patient was prepped and draped in a sterile fashion after injection of 5 mL of 1% lidocaine 1:100,000 epinephrine. 15 blade scalpel was used to make a skin incision at the site of his previous tracheostomy site and dissection was carried through subcutaneous tissues using bipolar and Harmonic  scalpel and Kelley clamp. Significant scarring was identified. The patient's thyroid was encountered. This was noted to be scarred down to the lateral aspect and the anterior aspect of the patient's trachea. This was carefully separated off of the patient's trachea using bipolar instruments. Hemostasis was achieved. A significant amount of scarring just superior to his previous tracheostomy site was encountered just below the level of the cricoid and meticulous dissection was made in this area. A cricoid hook was placed for retraction of the trachea superiorly and continued dissection was made using bipolar instruments. The trachea scar tissue was identified and the tracheostomy was entered through the previous scar site at the inferior aspect. At this time heavy curved Mayo scissors were used to enlarge tracheostomy site. Endotracheal tube was withdrawn superior to the tracheostomy site and a size 6 cuffed Shiley was placed through the tracheostomy site due to significant scarring and small tracheostomy window. At this time, ventilation was achieved with good CO2 return and good saturations and the tracheostomy tube site was sutured in place in a four-point fashion using silk suture. Surgicel was placed within the tracheostomy bed and a Dale collar was placed as well as Betadine-soaked gauze. At this time, care of the patient was transferred to anesthesia where the patient was in guarded condition.  ____________________________ Kyung Ruddreighton C. Kristyne Woodring, MD ccv:cms D: 02/14/2011 10:51:12 ET T: 02/14/2011 14:54:17 ET JOB#: [id number]  cc: Kyung Ruddreighton C. Maisha Bogen, MD, <Dictator> Kyung RuddREIGHTON C Hassaan Crite MD ELECTRONICALLY SIGNED 02/15/2011 10:25

## 2014-06-02 NOTE — Consult Note (Signed)
PATIENT NAME:  Robert [name], Robert [name] MR#:  [id number] DATE OF BIRTH:  12/15/1950  DATE OF CONSULTATION:  02/12/2011  REFERRING PHYSICIAN:   CONSULTING PHYSICIAN:  Kyung Ruddreighton [name]. Story Vanvranken, MD  REASON FOR CONSULT:  Respiratory failure.  HISTORY OF PRESENT ILLNESS: The patient is a 64 year old male with history of respiratory failure, chronic obstructive pulmonary disease, and oxygen dependence who was admitted for evaluation of respiratory failure. He required emergent intubation in the Emergency Department and has been ventilatory-dependent since that time with failure to wean from the vent. I was asked to evaluate for tracheostomy tube placement given the patient's poor prognosis and need for long-term ventilatory support.   PAST MEDICAL HISTORY: Significant for chronic obstructive pulmonary disease, coronary artery disease, v fib arrest, alcohol abuse, renal insufficiency, and tobacco abuse.   PAST SURGICAL HISTORY: Coronary stents, pacemaker, automatic defibrillator, inguinal hernia repair.   SOCIAL HISTORY: The patient is a tobacco and alcohol abuser and lives alone.    FAMILY HISTORY: Coronary artery disease and hyperlipidemia.   PHYSICAL EXAMINATION:  GENERAL: Well-nourished, well-developed male, thin, in no acute distress.   EARS: EACs are clear.   NOSE: Clear to anterior examination.   ORAL CAVITY AND OROPHARYNX:  OG tube and endotracheal tube that is secure and patent.   NECK: Neck reveals some good thyroid landmarks. No high-riding innominate artery. There is apparent previous tracheostomy scar.   IMPRESSION: Respiratory failure with need for long-term ventilatory support.   PLAN: The patient is a tracheostomy tube candidate. I discussed consent with the patient's family and if they agree we will schedule this for the near future.    ____________________________ Kyung Ruddreighton [name]. Silveria Botz, MD [id number]:[id number] D: 02/13/2011 10:00:54 ET T: 02/13/2011 13:24:31 ET JOB#: [id number]  cc: Kyung Ruddreighton [name].  Demtrius Rounds, MD, <Dictator> Kyung RuddREIGHTON [name] Bernhardt Riemenschneider MD ELECTRONICALLY SIGNED 02/14/2011 9:30

## 2014-06-02 NOTE — Consult Note (Signed)
Chief Complaint:   Subjective/Chief Complaint PEG is on hold after discussing the case with Dr. Belia HemanKasa yesterday as patient seems to be improving and may not need PEG. Plavix was restarted yesterday. Patient remains febrile. Will re-evaluate in few days to see if a PEG is needed or not. If a need for PEG seems likely at any point, please hold Plavix to avoid unnecessery delay in PEG placement.   Electronic Signatures: Lurline DelIftikhar, Leydi Winstead (MD)  (Signed 10-Jan-13 11:25)  Authored: Chief Complaint   Last Updated: 10-Jan-13 11:25 by Lurline DelIftikhar, Natale Barba (MD)

## 2014-06-02 NOTE — Consult Note (Signed)
Brief Consult Note: Diagnosis: Respiratory Failure.   Patient was seen by consultant.   Consult note dictated.   Recommend to proceed with surgery or procedure.   Comments: History of respiratory failure, O2 dep, COPD with failure to wean from vent.  Asked to place tracheostomy due to need for long term ventilation. PE GEN- WN, thin, sedated and intudated OC/OP- OG in place, ETT secure and patent Neck- good thyroid landmarks, previous tracheostomy tube scar  Impression:  Respiratory failure with history of previous tracheostomy Plan:  Patient is tracheostomy tube candidate.  Most likely permanent trach placement given patient's lung status as well as history of previous tracheostomy.  Will discuss with OR for scheduling.  Electronic Signatures: Cade Dashner, Rayfield Citizenreighton Charles (MD)  (Signed 05-Jan-13 09:56)  Authored: Brief Consult Note   Last Updated: 05-Jan-13 09:56 by Flossie DibbleVaught, Fotios Amos Charles (MD)

## 2014-06-02 NOTE — Consult Note (Signed)
Brief Consult Note: Diagnosis: ptsd, ro delirium.   Patient was seen by consultant.   Consult note dictated.   Recommend further assessment or treatment.   Orders entered.   Comments: Psychiatry: Patient seen. Chart reviewed. Difficult evaluation. Patient unable to communicate. Intermittant agitation. Poor sleep. Hard to tell how much is delirium vs anxiety d/o. I've made several changes to medication inc increase zoloft, cut down on total xanax dose, increase ambien and add low dose risperdal. Will follow.  Electronic Signatures: Audery Amellapacs, Emmerson Shuffield T (MD)  (Signed 17-Jan-13 18:15)  Authored: Brief Consult Note   Last Updated: 17-Jan-13 18:15 by Audery Amellapacs, Kendle Turbin T (MD)

## 2014-06-02 NOTE — Consult Note (Signed)
Brief Consult Note: Diagnosis: Need for PEG placement.   Patient was seen by consultant.   Consult note dictated.   Orders entered.   Discussed with Attending MD.   Comments: Patient with respiratory failure and anticipated long term need for ventilatory support. Agree with the need for Trach. and PEG. Dr. Andee PolesVaught is going to place Trach in next couple of days after which I will place the PEG if the famiy agrees. Patient is on Plavix and will hold it in preparation of PEG. Will continue baby ASA for now due to his significant h/o CAD and stenting. Discussed with Dr. Hilton SinclairWeiting. Thanks.  Electronic Signatures: Lurline DelIftikhar, Carder Yin (MD)  (Signed 05-Jan-13 13:57)  Authored: Brief Consult Note   Last Updated: 05-Jan-13 13:57 by Lurline DelIftikhar, Bernhardt Riemenschneider (MD)

## 2014-06-02 NOTE — Consult Note (Signed)
Chief Complaint:   Subjective/Chief Complaint Patient with high fever and leucocytosis. Will hold off PEG for now and continue to follow.   Electronic Signatures: Lurline DelIftikhar, Alisyn Lequire (MD)  (Signed 07-Jan-13 19:11)  Authored: Chief Complaint   Last Updated: 07-Jan-13 19:11 by Lurline DelIftikhar, Izzah Pasqua (MD)

## 2014-06-02 NOTE — Consult Note (Signed)
Comments   Met with pt's daughter, Claiborne Billings, who is pt's HCPOA (document completed in hospital). Claiborne Billings says that in 2010, pt had a 4 month stay at [facility] and had trach/PEG. He was discharged to "a rehab facility in [facility]" and was very pleased with his care there. Daughter hopes that pt can return there. She understands that pt will need a PEG for nutrition and is in agreement with this if pt is.  spoke with pt. I explained that a PEG would be needed for nutrition and that it would be much more comfortable than the NG he is being fed through currently. He shows me where he had his PEG previously and says it did not bother him. He would like to have NG out for comfort. In essence, pt is agreeable to proceed with PEG. Discussed with CM.  am certain that the facility daughter refers to in [facility]. [facility] representative here to evaluate pt.   Electronic Signatures: Shareka Casale, Izora Gala (MD)  (Signed 17-Jan-13 15:26)  Authored: Palliative Care   Last Updated: 17-Jan-13 15:26 by Shaunette Gassner, Izora Gala (MD)

## 2014-06-02 NOTE — Consult Note (Signed)
Chief Complaint:   Subjective/Chief Complaint Patient is off vent on trach. Collar. PEG on hold due to improvement in his condition. Recommend speech evaluation in next 24-48 hours. Further recommendations to follow. Discussed with his nurse.   Electronic Signatures: Lurline DelIftikhar, Krisna Omar (MD)  (Signed 14-Jan-13 10:50)  Authored: Chief Complaint   Last Updated: 14-Jan-13 10:50 by Lurline DelIftikhar, Daouda Lonzo (MD)

## 2014-06-02 NOTE — Consult Note (Signed)
PATIENT NAME:  PACE, LAMADRID MR#:  [id number] DATE OF BIRTH:  [birth year]  DATE OF CONSULTATION:  02/10/2011  REFERRING PHYSICIAN:  Delfino Lovett, MD  CONSULTING PHYSICIAN:  Rosalyn Gess. Gricel Copen, MD  REASON FOR CONSULTATION:  Respiratory failure.  HISTORY OF PRESENT ILLNESS:  The patient is a 64 year old white man with a past history significant for CHF and COPD who was admitted on 02/06/2011 with acute respiratory failure. The apparently had called 911 and when they arrived he had told them that he had been short of breath for approximately 3 hours. They brought him to the emergency room and he was intubated. He has remained intubated since then. He is more awake but unable to provide any history due to his recent intubation. He denies any current pain or shortness of breath. He is tolerating the ventilator well. He has not had any elevated temperatures during his hospitalization. His white count on admission, however, was 23.8. He is receiving steroids and his white count had increased to as high as 26.5 and has subsequently come down. He had a rapid influenza test on admission and a negative UA but no other cultures were obtained.   ALLERGIES:  None.  PAST MEDICAL HISTORY:  1. COPD. 2. Coronary artery disease status post MI. 3. Cardiomyopathy with an EF of 20%. 4. V-fib arrest status post AICD and pacemaker placement. 5. Alcohol abuse. 6. Renal insufficiency.  SOCIAL HISTORY: The patient lives by himself. He is an active smoker and drinker.   FAMILY HISTORY: Positive for coronary artery disease and hypercholesterolemia.   REVIEW OF SYSTEMS:  Unable to obtain from the patient due to his intubation.  PHYSICAL EXAMINATION:   VITAL SIGNS:  T-max 100.1, T current of 99.5, pulse 74, blood pressure 114/71, and 97% on the ventilator.  Vent settings include assist control at 16 with spontaneous rate of zero, tidal volume of 508, minute ventilation of 8.2, FIO2 of 35%, PEEP of 5.  GENERAL:  A  64 year old white man on the ventilator but in no acute distress.  HEENT:  Normocephalic, atraumatic. Pupils equal and reactive to light. Extraocular motion intact. Sclerae and conjunctivae and lids without evidence for emboli or petechiae. Oropharynx shows no erythema or exudate. Gums are in fair condition., although as he is orally intubated further exam is not possible.   NECK:  Midline trachea. No lymphadenopathy. No thyromegaly.   LUNGS: Clear to auscultation bilaterally. Good air movement. No focal consolidation.   HEART: Regular rate and rhythm without murmur, rub, or gallop.   ABDOMEN: Soft, nontender, nondistended. No hepatosplenomegaly. No hernias noted.   EXTREMITIES: No evidence for tenosynovitis.  SKIN:  No rashes. There were no stigmata for endocarditis, specifically no Janeway lesions or Osler nodes.   NEUROLOGIC:  The patient was awake and followed commands but he seemed somewhat sedated.   PSYCHIATRIC:  Unable to assess due to his current clinical situation.  LABORATORY DATA:  BUN 40, creatinine 1.52, bicarbonate 26, anion gap of 13. White count 13.6, hemoglobin 11.3, platelet count 197,000, ANC 12.8. White count on admission was 23.8.    A rapid influenza test was negative. A UA from admission was essentially unremarkable.  A chest x-ray from admission shows bilateral diffuse interstitial and alveolar airspace opacities with bilateral pleural effusion and cardiomegaly concerning for pulmonary edema. Repeat chest x-ray from 02/08/2011 shows improvement in the previous noted bilateral pulmonary infiltrates or edema.   IMPRESSION: A 64 year old white man with a history of congestive heart failure and chronic obstructive  pulmonary disease admitted with acute respiratory failure.  RECOMMENDATIONS: 1. Chest x-ray shows CHF and he has responded to diuresis. He remains on the ventilator but does not have significant wheezing. He is requiring relatively low amounts of oxygen. He  has not had any temperatures over 101 and no sputum or blood cultures were obtained on admission. There does not appear to be any evidence for infection other than his elevated white count on admission. Review of his prior white count shows his white count has never been normal over the past 6 months. 2. We will stop his antibiotics. 3. We will continue vent support as needed. 4. Would consider weaning his steroids. 5. If off antibiotics he spikes over 101.5 then would obtain sputum cultures, blood cultures, urinalysis, and repeat chest x-ray.   This is a high level infectious disease consult.  Thank you very much for involving me in Mr Chokshi' care.      ____________________________ Rosalyn GessMichael E. Harrell Niehoff, MD meb:ljs D: 02/10/2011 12:52:00 ET T: 02/10/2011 13:14:55 ET JOB#: [id number]  cc: Rosalyn GessMichael E. Tyrah Broers, MD, <Dictator> Vipul S. Sherryll BurgerShah, MD Rosalyn GessMICHAEL E Claudie Rathbone MD ELECTRONICALLY SIGNED 02/15/2011 12:55

## 2014-06-02 NOTE — H&P (Signed)
PATIENT NAME:  Robert DownsPPENS, Robert [name] MR#:  [id number] DATE OF BIRTH:  12-08-1950  DATE OF ADMISSION:  02/06/2011  CHIEF COMPLAINT: Respiratory failure.   HISTORY OF PRESENT ILLNESS: [name] is a 64 year old white male with a history of tobacco abuse, ongoing, chronic obstructive pulmonary disease-oxygen dependent with last intubation September 2012, ischemic cardiomyopathy with non-ST segment elevation myocardial infarction and cardiogenic shock in September 2012, medically managed, who was intubated emergently when brought to the ED by EMS. The only history we have is that the patient was able to tell EMS prior to transport that he had been short of breath for three hours. EMS states that although they did not detect alcohol on his breath, there was alcohol on the scene. They did not see any tobacco, but they saw lots of 02 canisters. No other history is available. The ED physician quickly evaluated the patient and got implied consent from the patient to intubate him on the scene.   The patient's primary care doctor is either Dr. Juel BurrowMasoud or Dr. Welton FlakesKhan. It is unclear. The last discharge summary states that he was going to establish primary care with Dr. Welton FlakesKhan.   PAST MEDICAL HISTORY:  1. Chronic obstructive pulmonary disease on 2 liters with last intubation in September 2012 and prior intubation by virtue of a tracheotomy scar on his manubrium.  2. Coronary artery disease with non-ST segment elevation myocardial infarction August 2012. Catheterization done on August 31st showed three-vessel disease, 75% stenosis of the LAD, with medical management recommended. He did have cardiogenic shock during that admission. He cardiomyopathy with an ejection fraction of 20% by a prior admission.  3. History of ventricular fibrillation arrest, status post automatic implantable cardiac defibrillator and pacemaker. 4. History of alcohol abuse. 5. History of renal insufficiency.   PAST SURGICAL HISTORY:  1. [facility]  [facility]  2. History of pacemaker insertion. 3. History of automatic implantable cardiac defibrillator insertion.  4. History of inguinal hernia repair.  CURRENT MEDICATIONS: (Per last discharge summary) 1. Aspirin 81 mg daily. 2. Spiriva 18 mcg nebulized daily.  3. Advair 250/50, 1 puff b.i.d.  4. Fluticasone 2 puffs b.i.d.  5. O2 by nasal cannula.  6. Coreg 3.125 mg b.i.d.  7. Lisinopril 2.5 mg b.i.d.  8. Lasix 20 mg b.i.d.  9. Fish oil. 10. Spironolactone 25 mg b.i.d.  11. Xanax 0.25 mg b.i.d.  12. Prilosec 20 mg b.i.d.  13. Lipitor 40 mg at bedtime.   FAMILY HISTORY: Notable for premature coronary artery disease and hyperlipidemia.   SOCIAL HISTORY: He is an ongoing tobacco and alcohol abuser. He lives alone.   REVIEW OF SYSTEMS: Review of systems is not obtainable due to intubated status.   PHYSICAL EXAMINATION:  GENERAL: This is a chronically ill-appearing middle-aged male who is critically ill.    VITAL SIGNS: At admission, blood pressure is 118/89, pulse 104 and regular, currently sating at 95 to 100% on 100% FiO2. Current vent settings: He is on assist control, rate of 16, tidal volumes of 500, with a PEEP of 5.   HEENT: Pupils are pinpoint and unreactive at this point. Sclerae are anicteric but bloodshot. He has bilateral erythema of both conjunctiva with no edema. He is currently intubated.   NECK: Supple. There are no carotid bruits, JVD or thyromegaly.   LUNGS: Notable for diffuse rhonchi bilaterally.   ABDOMEN: Distended. No masses appreciated. Normal bowel sounds.   CARDIOVASCULAR: Tachycardic and regular. Chest wall is moving  symmetrically. He does have a pacemaker and defibrillator in place.  EXTREMITIES: Extremities are cool. Dorsalis pedis pulses are palpable.   SKIN: His skin is dry. His capillary refill is less than 2 seconds.   NEUROLOGICAL:  The patient is sedated and intubated at this point.   ADMISSION  LABORATORY, DIAGNOSTIC AND RADIOLOGICAL DATA:   White count 23.8, hemoglobin 14.2, platelets 277.  B-type natriuretic peptide is 16,388.  Sodium 143, potassium 3.9, chloride 106, bicarbonate 22, BUN 19, creatinine 1.47.  Liver function tests: Hepatic panel is normal.  CK is 80, CK-MB is 1.4. Troponin I is less than 0.02. Influenza A and B test was negative.  Urinalysis is normal.  Portable chest x-ray shows bilateral alveolar infiltrates. ET tube is in good position. Bilateral pleural effusions and cardiomegaly are also present.  A twelve-lead EKG shows sinus tachycardia with paced.   PLAN:  1. Vent-dependent respiratory failure, likely multifactorial secondary to chronic obstructive pulmonary disease exacerbation and pulmonary edema: The patient will receive IV Solu-Medrol, inhaled bronchodilators, empiric antibiotics and 40 mg of Lasix IV every 6 hours p.r.n. 2. Pulmonary consult will be ordered. The patient is currently on assist control and sating well, although he does appear to be very bronchospastic. We will start bronchodilator therapy immediately.  3. Ischemic cardiomyopathy, ejection fraction 20 to 30%: We will continue Lasix for diuresis, cycle cardiac enzymes, continue beta blocker, and do not see a need to repeat an echocardiogram at this time since he had a cardiac catheterization and echo done just three months ago.  4. History of alcohol abuse: The patient is on Versed and fentanyl drip and will likely require detox if he survives to extubation.  5. Chronic kidney disease: Unclear what the patient's baseline creatinine is. During last admission his creatinine went as high as 2.4 and was 1.16 prior to discharge. This should improve with diuresis but will follow creatinine closely and obtain Nephrology consult if he develops worsening renal insufficiency.   PROGNOSIS: Prognosis is poor.   CRITICAL CARE TIME SPENT: 60 minutes.   ____________________________ Duncan Dull,  MD tt:cbb D: 02/06/2011 21:11:16 ET T: 02/07/2011 09:37:16 ET JOB#: [id number]  cc: Duncan Dull, MD, <Dictator> Duncan Dull MD ELECTRONICALLY SIGNED 03/12/2011 18:22

## 2014-06-02 NOTE — Discharge Summary (Signed)
PATIENT NAME:  Robert [name], Robert [name] MR#:  [id number] DATE OF BIRTH:  [birth year]  DATE OF ADMISSION:  02/06/2011 DATE OF DISCHARGE:  02/26/2011  Please refer to interim discharge summary dictated by Dr. Renae Gloss on 02/19/2011.   ADMITTING DIAGNOSIS: Acute respiratory failure.   DISCHARGE DIAGNOSES:  1. Acute on chronic respiratory failure.  2. Chronic obstructive pulmonary disease exacerbation status post tracheostomy tube placement on 02/14/2011 complicated by Serratia pneumonia, Serratia sepsis due to pneumonia.  3. Congestive heart failure, left heart, acute on chronic, systolic.  4. Cardiomyopathy with ejection fraction of 20% to 30%.  5. Cardiogenic shock.  6. Pulmonary edema, resolving.  7. History of alcohol, tobacco abuse, ongoing.  8. Generalized weakness.  9. Ileus with nausea, vomiting as well as diarrhea, resolving.  10. Clostridium difficile negative.  11. Leukocytosis due to Serratia infection, resolving.   DISCHARGE CONDITION: Guarded.   DISCHARGE MEDICATIONS:  1. Aspirin 81 mg per G-tube daily.  2. Plavix 75 mg daily. 3. Spiriva 1 capsule inhalation daily.  4. Spironolactone 25 mg daily.  5. Lisinopril 2.5 mg p.o. twice daily.  6. Coreg 3.125 mg twice daily.   ADDITIONAL MEDICATIONS:  1. Chlorhexidine 0.12% liquid 5 mL every 12 hours for oral care.  2. Combivent 8 puffs every four hours.  3. Lovenox 40 mg subcutaneously daily.  4. Sliding scale insulin. 5. Fluticasone HFA 220 mcg 2 puffs every 12 hours.  6. Line flushes with normal saline as well as heparin as per protocol.  7. Theragran liquid 5 mL per G-tube twice daily.  8. Tylenol suppository 650 mg every six hours as needed. Also per G-tube 650 mg q.6 hours as needed.   9. Rocephin 2 grams IV daily for six more days to complete 14 day course.  10. Zofran 4 mg IV every four hours as needed.  11. Norco 5/325 mg 1 to 2 tablets every four hours as needed.  12. Haldol 1 to 2 mg every four hours as needed.   13. Lactobacillus probiotic capsule per G-tube t.i.d.  14. Zantac 150 mg twice daily.  15. Phenol 1.5% two sprays orally every three hours as needed.  16. Scopolamine patch one topically every three days as needed.  17. Lasix 20 mg IV twice daily.  18. Omeprazole 40 mg twice daily.  19. Klor-Con 20 mEq per G-tube twice daily.  20. Ambien 10 mg per G-tube at bedtime as needed.  21. Xanax 0.25 mg every six hours as needed.  22. Zoloft 100 mg daily.  23. Risperdal 0.5 mg twice daily. All medication should be given per G-tube at this point.  24. Precedex drip rate of 0.2 mcg/kg an hour, initial rate of 3.5 mL/h for sedation.   DIET: Vital 1.5 RTH at 47 mL/h with 40 mL/h free water flushes. Hold tube feeds if residuals more than 200 mL. Check every two hours and restart when residuals less than 100 mL. If tolerating titrate by 20 mL/12 hours until goal of 47 mL/h is reached.    LABORATORY, DIAGNOSTIC AND RADIOLOGICAL DATA: Ventilator at tidal volume of 500 assist control, PEEP 5, mechanical rate 20. His most recent ABG 02/26/2011: pH  7.46, pCO2 47, pO2 78, saturation 96.1% at FiO2 of 35%.   Initial lab data showed B-type natriuretic peptide elevated to 618,388, glucose 200, BUN and creatinine were 19 and 1.47 otherwise comprehensive metabolic panel was unremarkable. First set of cardiac enzymes were unremarkable, however, second set showed troponin elevation to 1.0, CK-MB fraction of 4.3 and  CK total 74, third set showed CK total 83, MB fraction 5.0 and troponin of 0.62. His initial white blood cell count was 23.8 thousand, hemoglobin 14.1, platelet count 277. Patient's influenza testing was negative. Sputum cultures taken on 02/11/2011 showed Serratia marcescens heavy growth sensitive to all antibiotics except cefazolin as well as cefuroxime. Blood cultures taken 02/14/2011 showed Serratia marcescens in aerobic as well as anaerobic bottles. Repeated sputum cultures on 02/16/2011 also showed Serratia  marcescens heavy growth. Urinalysis initially on 02/06/2011 was unremarkable.   HISTORY OF PRESENT ILLNESS: Patient is a 64 year old Caucasian male with history of chronic obstructive pulmonary disease, ongoing tobacco abuse with intubations in the past, most recent in September 2012, also history of ischemic cardiomyopathy and myocardial infarction as well as cardiogenic shock in 10/2010 presented to the hospital in respiratory failure. He was emergently intubated in the Emergency Room. He was able to tell EMS that he was short of breath for three hours. On arrival to the Emergency Room patient's blood pressure was 118/89, pulse was 104 and regular, saturation was 95% to 100% on 100% FiO2. His physical exam initially showed that he was tachycardic. He had diffuse rhonchi bilaterally on his lung exam. His initial chest x-ray done 02/06/2011 showed bilateral diffuse interstitial as well as alveolar airspace opacities with bilateral pleural effusions and cardiomegaly most concerning for pulmonary edema. The patient was, as mentioned above, intubated and was managed for his pulmonary edema. His blood pressure was running low and he was in cardiogenic shock.   HOSPITAL COURSE: Patient was admitted to Intensive Care Unit. He was managed on pressors to control his cardiogenic shock as well as antibiotic therapy for his pneumonia. When patient's cultures came back positive for Serratia marcescens consultation with Dr. Leavy CellaBlocker was obtained. Dr. Leavy CellaBlocker recommended Rocephin IV. Patient has been receiving Rocephin IV for the past 7 to 8 days now, he is improving clinically, however, still has some thick secretions through his T-tube. He underwent T-tube placement on 02/14/2011. He was kept intubated and mechanically ventilated. Attempt was made to  wean him off vent; however, it was very difficult to do. He would fail weaning periodically. For this reason social workers were consulted and patient will be transferred to  outside [facility] [place]aning. He is to continue antibiotic therapy for six more days to complete a 14 day course per our ID physician, Dr. Leavy CellaBlocker, recommendations Patient was on steroids, however, steroids were tapered and he was not wheezing recently. Patient was noted also to have ileus accompanied by some nausea, vomiting and diarrhea. Patient was started on Reglan and ileus resolved and he was able to tolerate tube feeds. Stool was checked for C. difficile and was negative most recently. Leukocytosis which was noted on admission seemed to be improving as time progresses. On 02/26/2011 patient's white blood cell count is 12.4. His hemoglobin, however, with rehydration seemed to be drifting down and on day of discharge, 03/08/2011, patient's hemoglobin level is 9.2. It is recommended to follow patient's CBC, white blood cell count as well as hemoglobin levels as outpatient to ensure its stability. Patient is being discharged in stable condition to be weaned off ventilator in [facility]. He initially refused PEG tube placement however, now when talking to him he seemed to be agreeable for G-tube placement and tube feeds for some period of time. We expect to have PEG tube placed in outpatient facility in the nearest future. On the  day of discharge patient's vital signs: Temperature 99.6, pulse 96, respiration rate 24, blood pressure 101/65, saturation 99% on 35% FiO2 through the vent.   TIME SPENT: 40 minutes.  ____________________________ Katharina Caper, MD rv:cms D: 02/26/2011 09:51:08 ET T: 02/26/2011 10:36:54 ET JOB#: [id number]  cc: Katharina Caper, MD, <Dictator> Arriana Lohmann MD ELECTRONICALLY SIGNED 03/08/2011 7:49
